# Patient Record
Sex: Female | Born: 1955
Health system: Southern US, Community
[De-identification: ages and names within clinical notes are randomized; demographics above are authoritative.]

## PROBLEM LIST (undated history)

## (undated) DIAGNOSIS — K219 Gastro-esophageal reflux disease without esophagitis: Secondary | ICD-10-CM

## (undated) DIAGNOSIS — F419 Anxiety disorder, unspecified: Secondary | ICD-10-CM

## (undated) DIAGNOSIS — E785 Hyperlipidemia, unspecified: Secondary | ICD-10-CM

## (undated) HISTORY — PX: BUNIONECTOMY: SHX129

## (undated) HISTORY — PX: APPENDECTOMY: SHX54

## (undated) HISTORY — DX: Hyperlipidemia, unspecified: E78.5

## (undated) HISTORY — PX: ABDOMINAL HYSTERECTOMY: SHX81

## (undated) HISTORY — PX: TONSILLECTOMY: SUR1361

## (undated) HISTORY — PX: CHOLECYSTECTOMY: SHX55

## (undated) HISTORY — PX: BREAST EXCISIONAL BIOPSY: SUR124

---

## 2014-02-02 LAB — HM PAP SMEAR

## 2014-10-05 ENCOUNTER — Other Ambulatory Visit: Payer: Self-pay | Admitting: Internal Medicine

## 2014-10-05 DIAGNOSIS — Z719 Counseling, unspecified: Secondary | ICD-10-CM

## 2014-10-05 DIAGNOSIS — N6452 Nipple discharge: Secondary | ICD-10-CM

## 2014-10-12 ENCOUNTER — Ambulatory Visit
Admission: RE | Admit: 2014-10-12 | Discharge: 2014-10-12 | Disposition: A | Discharge status: Home / Self Care | Payer: Managed Care, Other (non HMO) | Source: Ambulatory Visit | Attending: Internal Medicine | Admitting: Internal Medicine

## 2014-10-12 DIAGNOSIS — Z719 Counseling, unspecified: Secondary | ICD-10-CM

## 2014-10-15 ENCOUNTER — Ambulatory Visit: Payer: Self-pay | Admitting: Surgery

## 2014-10-15 NOTE — H&P (Signed)
Lynn Bautista 10/15/2014 3:03 PM Location: [facility] Patient #: [id number] DOB: 1955/12/25 Divorced / Language: Lynn Bautista / Race: White Female History of Present Illness Lynn Moores A. Ellsie Violette MD; 10/15/2014 3:45 PM) Patient words: right breast  Pt sent at the request of Dr Enriqueta Shutter for right breast bloody nipple discharge since february of this year. It is scant but persistent. It is dark in nature. Mammgram normal and U/S showed a 1.6 cm hypoechoic lesion. Bx done and showed stromal fibrosis. Family history of breast cancer. No mass or pain. Dense breast tissue on mammogram.  The patient is a 59 year old female   Other Problems Lynn Bautista, Lynn Bautista; 10/15/2014 3:03 PM) Anxiety Disorder Cancer Chest pain Cholelithiasis Gastric Ulcer Gastroesophageal Reflux Disease Migraine Headache  Past Surgical History Lynn Bautista, CMA; 10/15/2014 3:03 PM) Appendectomy Breast Biopsy Bilateral. multiple Breast Mass; Local Excision Right. Colon Polyp Removal - Colonoscopy Foot Surgery Bilateral. Gallbladder Surgery - Laparoscopic Hysterectomy (not due to cancer) - Partial Oral Surgery Tonsillectomy  Diagnostic Studies History Lynn Bautista, CMA; 10/15/2014 3:03 PM) Colonoscopy within last year Mammogram within last year Pap Smear 1-5 years ago  Allergies Lynn Bautista, CMA; 10/15/2014 3:03 PM) No Known Drug Allergies 10/15/2014  Medication History (Lynn Bautista, CMA; 10/15/2014 3:03 PM) Atorvastatin Calcium (10MG  Tablet, Oral) Active. Venlafaxine HCl ER (75MG  Capsule ER 24HR, Oral) Active.  Social History Lynn Bautista, CMA; 10/15/2014 3:03 PM) Alcohol use Moderate alcohol use. Caffeine use Carbonated beverages, Coffee, Tea. No drug use Tobacco use Current some day smoker.  Family History Lynn Bautista, CMA; 10/15/2014 3:03 PM) Bleeding disorder Brother. Breast Cancer Mother. Cancer Mother. Heart Disease Father, Mother. Migraine Headache Brother,  Mother, Sister. Prostate Cancer Father.  Pregnancy / Birth History Lynn Bautista, [place]; 10/15/2014 3:03 PM) Age at menarche 59 years. Age of menopause 83-50 Contraceptive History Oral contraceptives. Gravida 2 Maternal age 4-20 Para 2     Review of Systems (Lynn Bautista; 10/15/2014 3:03 PM) General Not Present- Appetite Loss, Chills, Fatigue, Fever, Night Sweats, Weight Gain and Weight Loss. Skin Not Present- Change in Wart/Mole, Dryness, Hives, Jaundice, New Lesions, Non-Healing Wounds, Rash and Ulcer. HEENT Not Present- Earache, Hearing Loss, Hoarseness, Nose Bleed, Oral Ulcers, Ringing in the Ears, Seasonal Allergies, Sinus Pain, Sore Throat, Visual Disturbances, Wears glasses/contact lenses and Yellow Eyes. Respiratory Not Present- Bloody sputum, Chronic Cough, Difficulty Breathing, Snoring and Wheezing. Breast Present- Nipple Discharge. Not Present- Breast Mass, Breast Pain and Skin Changes. Cardiovascular Not Present- Chest Pain, Difficulty Breathing Lying Down, Leg Cramps, Palpitations, Rapid Heart Rate, Shortness of Breath and Swelling of Extremities. Gastrointestinal Not Present- Abdominal Pain, Bloating, Bloody Stool, Change in Bowel Habits, Chronic diarrhea, Constipation, Difficulty Swallowing, Excessive gas, Gets full quickly at meals, Hemorrhoids, Indigestion, Nausea, Rectal Pain and Vomiting. Female Genitourinary Not Present- Frequency, Nocturia, Painful Urination, Pelvic Pain and Urgency. Musculoskeletal Not Present- Back Pain, Joint Pain, Joint Stiffness, Muscle Pain, Muscle Weakness and Swelling of Extremities. Neurological Not Present- Decreased Memory, Fainting, Headaches, Numbness, Seizures, Tingling, Tremor, Trouble walking and Weakness. Psychiatric Present- Anxiety. Not Present- Bipolar, Change in Sleep Pattern, Depression, Fearful and Frequent crying. Endocrine Not Present- Cold Intolerance, Excessive Hunger, Hair Changes, Heat Intolerance, Hot flashes and New  Diabetes. Hematology Not Present- Easy Bruising, Excessive bleeding, Gland problems, HIV and Persistent Infections.  Vitals (Lynn Bautista CMA; 10/15/2014 3:04 PM) 10/15/2014 3:04 PM Weight: 160 lb Height: 61in Body Surface Area: 1.77 m Body Mass Index: 30.23 kg/m Temp.: 54F(Temporal)  Pulse: 77 (Regular)  BP: 130/74 (Sitting, Left Arm,  Standard)     Physical Exam (Lynn Bautista A. Lynn Alvillar MD; 10/15/2014 3:43 PM)  General Mental Status-Alert. General Appearance-Consistent with stated age. Hydration-Well hydrated. Voice-Normal.  Head and Neck Head-normocephalic, atraumatic with no lesions or palpable masses. Trachea-midline. Thyroid Gland Characteristics - normal size and consistency.  Chest and Lung Exam Chest and lung exam reveals -quiet, even and easy respiratory effort with no use of accessory muscles and on auscultation, normal breath sounds, no adventitious sounds and normal vocal resonance. Inspection Chest Wall - Normal. Back - normal.  Breast Note: dense breast tissue brown right nipple discharge no mass subareolar palpated on the right minimal discharge left breast has no nipple discharge, mass or deformity.   Cardiovascular Cardiovascular examination reveals -normal heart sounds, regular rate and rhythm with no murmurs and normal pedal pulses bilaterally.  Musculoskeletal Normal Exam - Left-Upper Extremity Strength Normal and Lower Extremity Strength Normal. Normal Exam - Right-Upper Extremity Strength Normal and Lower Extremity Strength Normal.  Lymphatic Head & Neck  General Head & Neck Lymphatics: Bilateral - Description - Normal. Axillary  General Axillary Region: Bilateral - Description - Normal. Tenderness - Non Tender.    Assessment & Plan (Lynn Nocito A. Refoel Palladino MD; 10/15/2014 3:45 PM)  NIPPLE DISCHARGE, BLOODY (N64.52) Impression: right breast core biopsy nondiagnostic showing stromal fibrosis and 1 .6 cm hypoechoic  lesion at 9 oclock not palpable on exam. I suspect the biopsy is non diagnostic at this point. I suspect a papiloma as likely cause and carcinoma less likely. I dont' think a MRI useful at this point. Recommend central duct excsicion and treatment of discharge and would give better tissue. Risk of bleeding infection cosmetic deformity nipple loss and the need for mre surgery. Success rates of 95 % at treating and diagnosing this problem. Localizing the clip not useful in this setting since pathology does not match up to her clinical presentation. She wishes to proceed .  Current Plans Pt Education - [facility] Free Text Education/Instructions: discussed with patient and provided information. Pt Education - Nipple discharge: discussed with patient and provided information.

## 2014-12-24 ENCOUNTER — Encounter (HOSPITAL_BASED_OUTPATIENT_CLINIC_OR_DEPARTMENT_OTHER): Payer: Self-pay | Admitting: *Deleted

## 2014-12-26 ENCOUNTER — Encounter (HOSPITAL_BASED_OUTPATIENT_CLINIC_OR_DEPARTMENT_OTHER)
Admission: RE | Admit: 2014-12-26 | Discharge: 2014-12-26 | Disposition: A | Discharge status: Home / Self Care | Payer: Managed Care, Other (non HMO) | Source: Ambulatory Visit | Attending: Surgery | Admitting: Surgery

## 2014-12-26 DIAGNOSIS — N6011 Diffuse cystic mastopathy of right breast: Secondary | ICD-10-CM | POA: Diagnosis not present

## 2014-12-26 DIAGNOSIS — K219 Gastro-esophageal reflux disease without esophagitis: Secondary | ICD-10-CM | POA: Diagnosis not present

## 2014-12-26 DIAGNOSIS — Z90710 Acquired absence of both cervix and uterus: Secondary | ICD-10-CM | POA: Diagnosis not present

## 2014-12-26 DIAGNOSIS — N6452 Nipple discharge: Secondary | ICD-10-CM | POA: Diagnosis present

## 2014-12-26 DIAGNOSIS — F418 Other specified anxiety disorders: Secondary | ICD-10-CM | POA: Diagnosis not present

## 2014-12-26 DIAGNOSIS — Z79899 Other long term (current) drug therapy: Secondary | ICD-10-CM | POA: Diagnosis not present

## 2014-12-26 DIAGNOSIS — F17200 Nicotine dependence, unspecified, uncomplicated: Secondary | ICD-10-CM | POA: Diagnosis not present

## 2014-12-26 DIAGNOSIS — Z803 Family history of malignant neoplasm of breast: Secondary | ICD-10-CM | POA: Diagnosis not present

## 2014-12-26 LAB — COMPREHENSIVE METABOLIC PANEL
ALK PHOS: 81 U/L (ref 38–126)
ALT: 46 U/L (ref 14–54)
AST: 25 U/L (ref 15–41)
Albumin: 3.9 g/dL (ref 3.5–5.0)
Anion gap: 7 (ref 5–15)
BILIRUBIN TOTAL: 0.7 mg/dL (ref 0.3–1.2)
BUN: 15 mg/dL (ref 6–20)
CALCIUM: 9.4 mg/dL (ref 8.9–10.3)
CO2: 28 mmol/L (ref 22–32)
CREATININE: 0.9 mg/dL (ref 0.44–1.00)
Chloride: 104 mmol/L (ref 101–111)
GFR calc non Af Amer: >60 mL/min (ref >60)
Glucose, Bld: 111 mg/dL — ABNORMAL HIGH (ref 65–99)
Potassium: 4 mmol/L (ref 3.5–5.1)
SODIUM: 139 mmol/L (ref 135–145)
TOTAL PROTEIN: 6.8 g/dL (ref 6.5–8.1)

## 2014-12-26 LAB — CBC WITH DIFFERENTIAL/PLATELET
Basophils Absolute: 0 10*3/uL (ref 0.0–0.1)
Basophils Relative: 1 %
EOS ABS: 0.2 10*3/uL (ref 0.0–0.7)
Eosinophils Relative: 3 %
HEMATOCRIT: 41.3 % (ref 36.0–46.0)
HEMOGLOBIN: 13.7 g/dL (ref 12.0–15.0)
LYMPHS ABS: 2.4 10*3/uL (ref 0.7–4.0)
LYMPHS PCT: 38 %
MCH: 32.2 pg (ref 26.0–34.0)
MCHC: 33.2 g/dL (ref 30.0–36.0)
MCV: 96.9 fL (ref 78.0–100.0)
MONOS PCT: 10 %
Monocytes Absolute: 0.6 10*3/uL (ref 0.1–1.0)
NEUTROS ABS: 3.1 10*3/uL (ref 1.7–7.7)
NEUTROS PCT: 48 %
Platelets: 311 10*3/uL (ref 150–400)
RBC: 4.26 MIL/uL (ref 3.87–5.11)
RDW: 12.9 % (ref 11.5–15.5)
WBC: 6.3 10*3/uL (ref 4.0–10.5)

## 2015-01-02 ENCOUNTER — Ambulatory Visit (HOSPITAL_BASED_OUTPATIENT_CLINIC_OR_DEPARTMENT_OTHER)
Admission: RE | Admit: 2015-01-02 | Discharge: 2015-01-02 | Disposition: A | Discharge status: Home / Self Care | Payer: Managed Care, Other (non HMO) | Source: Ambulatory Visit | Attending: Surgery | Admitting: Surgery

## 2015-01-02 ENCOUNTER — Ambulatory Visit (HOSPITAL_BASED_OUTPATIENT_CLINIC_OR_DEPARTMENT_OTHER): Payer: Managed Care, Other (non HMO) | Admitting: Certified Registered"

## 2015-01-02 ENCOUNTER — Encounter (HOSPITAL_BASED_OUTPATIENT_CLINIC_OR_DEPARTMENT_OTHER): Payer: Self-pay | Admitting: Certified Registered"

## 2015-01-02 ENCOUNTER — Encounter (HOSPITAL_BASED_OUTPATIENT_CLINIC_OR_DEPARTMENT_OTHER)
Admission: RE | Disposition: A | Discharge status: Home / Self Care | Payer: Self-pay | Source: Ambulatory Visit | Attending: Surgery

## 2015-01-02 DIAGNOSIS — Z79899 Other long term (current) drug therapy: Secondary | ICD-10-CM | POA: Insufficient documentation

## 2015-01-02 DIAGNOSIS — N6011 Diffuse cystic mastopathy of right breast: Secondary | ICD-10-CM | POA: Diagnosis not present

## 2015-01-02 DIAGNOSIS — F418 Other specified anxiety disorders: Secondary | ICD-10-CM | POA: Insufficient documentation

## 2015-01-02 DIAGNOSIS — K219 Gastro-esophageal reflux disease without esophagitis: Secondary | ICD-10-CM | POA: Insufficient documentation

## 2015-01-02 DIAGNOSIS — N6452 Nipple discharge: Secondary | ICD-10-CM | POA: Insufficient documentation

## 2015-01-02 DIAGNOSIS — Z90710 Acquired absence of both cervix and uterus: Secondary | ICD-10-CM | POA: Insufficient documentation

## 2015-01-02 DIAGNOSIS — Z803 Family history of malignant neoplasm of breast: Secondary | ICD-10-CM | POA: Insufficient documentation

## 2015-01-02 DIAGNOSIS — F17200 Nicotine dependence, unspecified, uncomplicated: Secondary | ICD-10-CM | POA: Insufficient documentation

## 2015-01-02 HISTORY — PX: BREAST DUCTAL SYSTEM EXCISION: SHX5242

## 2015-01-02 HISTORY — DX: Gastro-esophageal reflux disease without esophagitis: K21.9

## 2015-01-02 HISTORY — DX: Anxiety disorder, unspecified: F41.9

## 2015-01-02 SURGERY — EXCISION DUCTAL SYSTEM BREAST
Anesthesia: General | Site: Breast | Laterality: Right

## 2015-01-02 MED ORDER — PROPOFOL 10 MG/ML IV BOLUS
INTRAVENOUS | Status: DC | PRN
  Administered 2015-01-02: 200 mg via INTRAVENOUS

## 2015-01-02 MED ORDER — DEXTROSE 5 % IV SOLN
3.0000 g | INTRAVENOUS | Status: AC
  Administered 2015-01-02: 2 g via INTRAVENOUS

## 2015-01-02 MED ORDER — LACTATED RINGERS IV SOLN
INTRAVENOUS | Status: DC
  Administered 2015-01-02 (×2): via INTRAVENOUS

## 2015-01-02 MED ORDER — HYDROMORPHONE HCL 1 MG/ML IJ SOLN: 0.2500 mg | INTRAMUSCULAR | Status: DC | PRN

## 2015-01-02 MED ORDER — BUPIVACAINE-EPINEPHRINE (PF) 0.25% -1:200000 IJ SOLN
INTRAMUSCULAR | Status: AC
  Filled 2015-01-02: qty 30

## 2015-01-02 MED ORDER — OXYCODONE-ACETAMINOPHEN 5-325 MG PO TABS: 1.0000 | ORAL_TABLET | ORAL | Status: DC | PRN

## 2015-01-02 MED ORDER — PROPOFOL 10 MG/ML IV BOLUS
INTRAVENOUS | Status: AC
  Filled 2015-01-02: qty 20

## 2015-01-02 MED ORDER — LIDOCAINE HCL (CARDIAC) 20 MG/ML IV SOLN
INTRAVENOUS | Status: AC
  Filled 2015-01-02: qty 5

## 2015-01-02 MED ORDER — FENTANYL CITRATE (PF) 100 MCG/2ML IJ SOLN
INTRAMUSCULAR | Status: AC
  Filled 2015-01-02: qty 2

## 2015-01-02 MED ORDER — GLYCOPYRROLATE 0.2 MG/ML IJ SOLN: 0.2000 mg | Freq: Once | INTRAMUSCULAR | Status: DC | PRN

## 2015-01-02 MED ORDER — DEXAMETHASONE SODIUM PHOSPHATE 4 MG/ML IJ SOLN
INTRAMUSCULAR | Status: DC | PRN
  Administered 2015-01-02: 10 mg via INTRAVENOUS

## 2015-01-02 MED ORDER — ONDANSETRON HCL 4 MG/2ML IJ SOLN
INTRAMUSCULAR | Status: DC | PRN
  Administered 2015-01-02: 4 mg via INTRAVENOUS

## 2015-01-02 MED ORDER — ONDANSETRON HCL 4 MG/2ML IJ SOLN
INTRAMUSCULAR | Status: AC
  Filled 2015-01-02: qty 2

## 2015-01-02 MED ORDER — LIDOCAINE HCL (CARDIAC) 20 MG/ML IV SOLN
INTRAVENOUS | Status: DC | PRN
  Administered 2015-01-02: 60 mg via INTRAVENOUS

## 2015-01-02 MED ORDER — CHLORHEXIDINE GLUCONATE 4 % EX LIQD: 1.0000 "application " | Freq: Once | CUTANEOUS | Status: DC

## 2015-01-02 MED ORDER — MEPERIDINE HCL 25 MG/ML IJ SOLN: 6.2500 mg | INTRAMUSCULAR | Status: DC | PRN

## 2015-01-02 MED ORDER — FENTANYL CITRATE (PF) 100 MCG/2ML IJ SOLN
50.0000 ug | INTRAMUSCULAR | Status: DC | PRN
  Administered 2015-01-02: 50 ug via INTRAVENOUS

## 2015-01-02 MED ORDER — MIDAZOLAM HCL 2 MG/2ML IJ SOLN
INTRAMUSCULAR | Status: AC
  Filled 2015-01-02: qty 2

## 2015-01-02 MED ORDER — CEFAZOLIN SODIUM-DEXTROSE 2-3 GM-% IV SOLR
INTRAVENOUS | Status: AC
  Filled 2015-01-02: qty 50

## 2015-01-02 MED ORDER — OXYCODONE HCL 5 MG PO TABS: 5.0000 mg | ORAL_TABLET | Freq: Once | ORAL | Status: DC | PRN

## 2015-01-02 MED ORDER — SCOPOLAMINE 1 MG/3DAYS TD PT72: 1.0000 | MEDICATED_PATCH | Freq: Once | TRANSDERMAL | Status: DC | PRN

## 2015-01-02 MED ORDER — MIDAZOLAM HCL 2 MG/2ML IJ SOLN
1.0000 mg | INTRAMUSCULAR | Status: DC | PRN
  Administered 2015-01-02: 2 mg via INTRAVENOUS

## 2015-01-02 MED ORDER — 0.9 % SODIUM CHLORIDE (POUR BTL) OPTIME
TOPICAL | Status: DC | PRN
  Administered 2015-01-02: 200 mL

## 2015-01-02 MED ORDER — BUPIVACAINE-EPINEPHRINE 0.25% -1:200000 IJ SOLN
INTRAMUSCULAR | Status: DC | PRN
  Administered 2015-01-02: 17 mL

## 2015-01-02 MED ORDER — OXYCODONE HCL 5 MG/5ML PO SOLN: 5.0000 mg | Freq: Once | ORAL | Status: DC | PRN

## 2015-01-02 MED ORDER — DEXAMETHASONE SODIUM PHOSPHATE 10 MG/ML IJ SOLN
INTRAMUSCULAR | Status: AC
  Filled 2015-01-02: qty 1

## 2015-01-02 SURGICAL SUPPLY — 43 items
APPLIER CLIP 9.375 MED OPEN (MISCELLANEOUS)
BLADE SURG 15 STRL LF DISP TIS (BLADE) ×1 IMPLANT
BLADE SURG 15 STRL SS (BLADE) ×2
CANISTER SUCT 1200ML W/VALVE (MISCELLANEOUS) ×3 IMPLANT
CHLORAPREP W/TINT 26ML (MISCELLANEOUS) ×3 IMPLANT
CLIP APPLIE 9.375 MED OPEN (MISCELLANEOUS) IMPLANT
CLIP TI WIDE RED SMALL 6 (CLIP) IMPLANT
COVER BACK TABLE 60X90IN (DRAPES) ×3 IMPLANT
COVER MAYO STAND STRL (DRAPES) ×3 IMPLANT
DECANTER SPIKE VIAL GLASS SM (MISCELLANEOUS) ×3 IMPLANT
DEVICE DUBIN W/COMP PLATE 8390 (MISCELLANEOUS) IMPLANT
DRAPE LAPAROSCOPIC ABDOMINAL (DRAPES) IMPLANT
DRAPE LAPAROTOMY 100X72 PEDS (DRAPES) ×3 IMPLANT
DRAPE UTILITY XL STRL (DRAPES) ×3 IMPLANT
ELECT COATED BLADE 2.86 ST (ELECTRODE) ×3 IMPLANT
ELECT REM PT RETURN 9FT ADLT (ELECTROSURGICAL) ×3
ELECTRODE REM PT RTRN 9FT ADLT (ELECTROSURGICAL) ×1 IMPLANT
GLOVE BIOGEL PI IND STRL 7.0 (GLOVE) ×4 IMPLANT
GLOVE BIOGEL PI IND STRL 8 (GLOVE) ×1 IMPLANT
GLOVE BIOGEL PI INDICATOR 7.0 (GLOVE) ×8
GLOVE BIOGEL PI INDICATOR 8 (GLOVE) ×2
GLOVE ECLIPSE 6.5 STRL STRAW (GLOVE) ×6 IMPLANT
GLOVE ECLIPSE 7.5 STRL STRAW (GLOVE) ×3 IMPLANT
GLOVE ECLIPSE 8.0 STRL XLNG CF (GLOVE) ×3 IMPLANT
GOWN STRL REUS W/ TWL LRG LVL3 (GOWN DISPOSABLE) ×2 IMPLANT
GOWN STRL REUS W/TWL LRG LVL3 (GOWN DISPOSABLE) ×4
LIQUID BAND (GAUZE/BANDAGES/DRESSINGS) ×3 IMPLANT
NEEDLE HYPO 25X1 1.5 SAFETY (NEEDLE) ×3 IMPLANT
NS IRRIG 1000ML POUR BTL (IV SOLUTION) ×3 IMPLANT
PACK BASIN DAY SURGERY FS (CUSTOM PROCEDURE TRAY) ×3 IMPLANT
PENCIL BUTTON HOLSTER BLD 10FT (ELECTRODE) ×3 IMPLANT
SLEEVE SCD COMPRESS KNEE MED (MISCELLANEOUS) ×3 IMPLANT
SPONGE LAP 4X18 X RAY DECT (DISPOSABLE) ×3 IMPLANT
STAPLER VISISTAT 35W (STAPLE) IMPLANT
SUT MON AB 4-0 PC3 18 (SUTURE) ×3 IMPLANT
SUT SILK 2 0 SH (SUTURE) IMPLANT
SUT VICRYL 3-0 CR8 SH (SUTURE) ×3 IMPLANT
SYR CONTROL 10ML LL (SYRINGE) ×3 IMPLANT
TOWEL OR 17X24 6PK STRL BLUE (TOWEL DISPOSABLE) ×6 IMPLANT
TOWEL OR NON WOVEN STRL DISP B (DISPOSABLE) ×3 IMPLANT
TUBE CONNECTING 20'X1/4 (TUBING) ×1
TUBE CONNECTING 20X1/4 (TUBING) ×2 IMPLANT
YANKAUER SUCT BULB TIP NO VENT (SUCTIONS) ×3 IMPLANT

## 2015-01-02 NOTE — Anesthesia Procedure Notes (Signed)
Procedure Name: LMA Insertion Date/Time: 01/02/2015 8:22 AM Performed by: Sabah Zucco D Pre-anesthesia Checklist: Patient identified, Emergency Drugs available, Suction available and Patient being monitored Patient Re-evaluated:Patient Re-evaluated prior to inductionOxygen Delivery Method: Circle System Utilized Preoxygenation: Pre-oxygenation with 100% oxygen Intubation Type: IV induction Ventilation: Mask ventilation without difficulty LMA: LMA inserted LMA Size: 4.0 Number of attempts: 1 Airway Equipment and Method: Bite block Placement Confirmation: positive ETCO2 Tube secured with: Tape Dental Injury: Teeth and Oropharynx as per pre-operative assessment

## 2015-01-02 NOTE — Interval H&P Note (Signed)
History and Physical Interval Note:  01/02/2015 7:55 AM  Lynn Bautista  has presented today for surgery, with the diagnosis of Right Nipple Discharge  The various methods of treatment have been discussed with the patient and family. After consideration of risks, benefits and other options for treatment, the patient has consented to  Procedure(s): RIGHT CENTRAL DUCT EXCISION (Right) as a surgical intervention .  The patient's history has been reviewed, patient examined, no change in status, stable for surgery.  I have reviewed the patient's chart and labs.  Questions were answered to the patient's satisfaction.     [name] A.

## 2015-01-02 NOTE — Anesthesia Preprocedure Evaluation (Addendum)
Anesthesia Evaluation  Patient identified by MRN, date of birth, ID band Patient awake    Reviewed: Allergy & Precautions, NPO status , Patient's Chart, lab work & pertinent test results  Airway Mallampati: I  TM Distance: >3 FB Neck ROM: Full    Dental  (+) Teeth Intact, Dental Advisory Given   Pulmonary Current Smoker,    breath sounds clear to auscultation       Cardiovascular  Rhythm:Regular Rate:Normal     Neuro/Psych PSYCHIATRIC DISORDERS Anxiety Depression    GI/Hepatic GERD  Medicated,  Endo/Other    Renal/GU      Musculoskeletal   Abdominal   Peds  Hematology   Anesthesia Other Findings   Reproductive/Obstetrics                            Anesthesia Physical Anesthesia Plan  ASA: II  Anesthesia Plan: General   Post-op Pain Management:    Induction: Intravenous  Airway Management Planned: LMA  Additional Equipment:   Intra-op Plan:   Post-operative Plan: Extubation in OR  Informed Consent: I have reviewed the patients History and Physical, chart, labs and discussed the procedure including the risks, benefits and alternatives for the proposed anesthesia with the patient or authorized representative who has indicated his/her understanding and acceptance.   Dental advisory given  Plan Discussed with: CRNA, Anesthesiologist and Surgeon  Anesthesia Plan Comments:         Anesthesia Quick Evaluation

## 2015-01-02 NOTE — Op Note (Signed)
Preoperative diagnosis: Right nipple bloody discharge  Postoperative diagnosis: Same  Procedure: Right breast central duct excision  Surgeon: Erroll Luna M.D.  Anesthesia: LMA with 0.25% Sensorcaine with epinephrine  EBL: Minimal  Specimen: Right breast central duct tissue pathology  Indications for procedure the patient presents due to a right breast nipple discharge. This is been present for number of months has been bloody in nature. Mammography and ultrasound revealed nonspecific findings in a biopsy of an area at 9:00 1.6 cm from the nipple was nondiagnostic. This was felt to be concerning and she was sent to me for evaluation. We discussed options of MRI versus excision to help treat nipple discharge. She decided to have the area removed since discharge at times could be problematic and an MRI would not assist in that.The procedure has been discussed with the patient. Alternatives to surgery have been discussed with the patient.  Risks of surgery include bleeding,  Infection,  Seroma formation, death,  and the need for further surgery.   The patient understands and wishes to proceed.  Description of procedure: The patient was met in the area and questions are answered. The right breast was marked with the assistance of the patient as the correct side. She was taken back to the operating room placed upon the operating room table. After induction of LMA anesthesia, the right breast was prepped and draped in a sterile fashion. Timeout was done and she received appropriate antibiotics. A probe was used and passed to the nipple in the corresponding duct was easily identified. Curvilinear incision was made along the inferior aspect of the nipple complex nipple was elevated. Dissection was done with the scalpel into we came across the daughter that the probe was in. That duct and subareolar tissue was excised and sent to pathology. Oriented prior to this. The wound was made hemostatic with cautery  and then closed with 3-0 Vicryl and 4-0 Monocryl. Liquid adhesive applied. All final counts found to be correct. Patient was awoke taken to recovery in satisfactory condition.

## 2015-01-02 NOTE — Transfer of Care (Signed)
Immediate Anesthesia Transfer of Care Note  Patient: Lynn Bautista  Procedure(s) Performed: Procedure(s): RIGHT BREAST CENTRAL DUCT EXCISION (Right)  Patient Location: PACU  Anesthesia Type:General  Level of Consciousness: awake and patient cooperative  Airway & Oxygen Therapy: Patient Spontanous Breathing and Patient connected to face mask oxygen  Post-op Assessment: Report given to RN and Post -op Vital signs reviewed and stable  Post vital signs: Reviewed and stable  Last Vitals:  Filed Vitals:   01/02/15 0740 01/02/15 0907  BP: 120/64   Pulse: 64 84  Temp: 36.8 C   Resp: 16     Complications: No apparent anesthesia complications

## 2015-01-02 NOTE — H&P (Signed)
H&P   Lynn Bautista (MR# [id number])      H&P Info    Author Note Status Last Update User Last Update Date/Time   Erroll Luna, MD Signed Erroll Luna, MD 10/15/2014 3:45 PM    H&P    Expand All Collapse All   Lynn Bautista 10/15/2014 3:03 PM Location: [facility] Patient #: [id number] DOB: 02-May-1955 Divorced / Language: [name] / Race: White Female History of Present Illness Lynn Moores A. Rylen Swindler MD; 10/15/2014 3:45 PM) Patient words: right breast  Pt sent at the request of Dr Enriqueta Shutter for right breast bloody nipple discharge since february of this year. It is scant but persistent. It is dark in nature. Mammgram normal and U/S showed a 1.6 cm hypoechoic lesion. Bx done and showed stromal fibrosis. Family history of breast cancer. No mass or pain. Dense breast tissue on mammogram.  The patient is a 59 year old female   Other Problems Marjean Donna, [place]; 10/15/2014 3:03 PM) Anxiety Disorder Cancer Chest pain Cholelithiasis Gastric Ulcer Gastroesophageal Reflux Disease Migraine Headache  Past Surgical History Marjean Donna, CMA; 10/15/2014 3:03 PM) Appendectomy Breast Biopsy Bilateral. multiple Breast Mass; Local Excision Right. Colon Polyp Removal - Colonoscopy Foot Surgery Bilateral. Gallbladder Surgery - Laparoscopic Hysterectomy (not due to cancer) - Partial Oral Surgery Tonsillectomy  Diagnostic Studies History Marjean Donna, CMA; 10/15/2014 3:03 PM) Colonoscopy within last year Mammogram within last year Pap Smear 1-5 years ago  Allergies Marjean Donna, CMA; 10/15/2014 3:03 PM) No Known Drug Allergies 10/15/2014  Medication History (Sonya Bynum, CMA; 10/15/2014 3:03 PM) Atorvastatin Calcium (10MG  Tablet, Oral) Active. Venlafaxine HCl ER (75MG  Capsule ER 24HR, Oral) Active.  Social History Marjean Donna, CMA; 10/15/2014 3:03 PM) Alcohol use Moderate alcohol use. Caffeine use Carbonated beverages, Coffee, Tea. No drug  use Tobacco use Current some day smoker.  Family History Marjean Donna, CMA; 10/15/2014 3:03 PM) Bleeding disorder Brother. Breast Cancer Mother. Cancer Mother. Heart Disease Father, Mother. Migraine Headache Brother, Mother, Sister. Prostate Cancer Father.  Pregnancy / Birth History Marjean Donna, [place]; 10/15/2014 3:03 PM) Age at menarche 34 years. Age of menopause 83-50 Contraceptive History Oral contraceptives. Gravida 2 Maternal age 33-20 Para 2     Review of Systems ([place]; 10/15/2014 3:03 PM) General Not Present- Appetite Loss, Chills, Fatigue, Fever, Night Sweats, Weight Gain and Weight Loss. Skin Not Present- Change in Wart/Mole, Dryness, Hives, Jaundice, New Lesions, Non-Healing Wounds, Rash and Ulcer. HEENT Not Present- Earache, Hearing Loss, Hoarseness, Nose Bleed, Oral Ulcers, Ringing in the Ears, Seasonal Allergies, Sinus Pain, Sore Throat, Visual Disturbances, Wears glasses/contact lenses and Yellow Eyes. Respiratory Not Present- Bloody sputum, Chronic Cough, Difficulty Breathing, Snoring and Wheezing. Breast Present- Nipple Discharge. Not Present- Breast Mass, Breast Pain and Skin Changes. Cardiovascular Not Present- Chest Pain, Difficulty Breathing Lying Down, Leg Cramps, Palpitations, Rapid Heart Rate, Shortness of Breath and Swelling of Extremities. Gastrointestinal Not Present- Abdominal Pain, Bloating, Bloody Stool, Change in Bowel Habits, Chronic diarrhea, Constipation, Difficulty Swallowing, Excessive gas, Gets full quickly at meals, Hemorrhoids, Indigestion, Nausea, Rectal Pain and Vomiting. Female Genitourinary Not Present- Frequency, Nocturia, Painful Urination, Pelvic Pain and Urgency. Musculoskeletal Not Present- Back Pain, Joint Pain, Joint Stiffness, Muscle Pain, Muscle Weakness and Swelling of Extremities. Neurological Not Present- Decreased Memory, Fainting, Headaches, Numbness, Seizures, Tingling, Tremor, Trouble walking and  Weakness. Psychiatric Present- Anxiety. Not Present- Bipolar, Change in Sleep Pattern, Depression, Fearful and Frequent crying. Endocrine Not Present- Cold Intolerance, Excessive Hunger, Hair Changes, Heat Intolerance, Hot flashes and New Diabetes.  Hematology Not Present- Easy Bruising, Excessive bleeding, Gland problems, HIV and Persistent Infections.  Vitals (Sonya Bynum CMA; 10/15/2014 3:04 PM) 10/15/2014 3:04 PM Weight: 160 lb Height: 61in Body Surface Area: 1.77 m Body Mass Index: 30.23 kg/m Temp.: 75F(Temporal)  Pulse: 77 (Regular)  BP: 130/74 (Sitting, Left Arm, Standard)     Physical Exam (Deserie Dirks A. Ameliyah Sarno MD; 10/15/2014 3:43 PM)  General Mental Status-Alert. General Appearance-Consistent with stated age. Hydration-Well hydrated. Voice-Normal.  Head and Neck Head-normocephalic, atraumatic with no lesions or palpable masses. Trachea-midline. Thyroid Gland Characteristics - normal size and consistency.  Chest and Lung Exam Chest and lung exam reveals -quiet, even and easy respiratory effort with no use of accessory muscles and on auscultation, normal breath sounds, no adventitious sounds and normal vocal resonance. Inspection Chest Wall - Normal. Back - normal.  Breast Note: dense breast tissue brown right nipple discharge no mass subareolar palpated on the right minimal discharge left breast has no nipple discharge, mass or deformity.   Cardiovascular Cardiovascular examination reveals -normal heart sounds, regular rate and rhythm with no murmurs and normal pedal pulses bilaterally.  Musculoskeletal Normal Exam - Left-Upper Extremity Strength Normal and Lower Extremity Strength Normal. Normal Exam - Right-Upper Extremity Strength Normal and Lower Extremity Strength Normal.  Lymphatic Head & Neck  General Head & Neck Lymphatics: Bilateral - Description - Normal. Axillary  General Axillary Region: Bilateral - Description -  Normal. Tenderness - Non Tender.    Assessment & Plan (Jovonda Selner A. Shaton Lore MD; 10/15/2014 3:45 PM)  NIPPLE DISCHARGE, BLOODY (N64.52) Impression: right breast core biopsy nondiagnostic showing stromal fibrosis and 1 .6 cm hypoechoic lesion at 9 oclock not palpable on exam. I suspect the biopsy is non diagnostic at this point. I suspect a papiloma as likely cause and carcinoma less likely. I dont' think a MRI useful at this point. Recommend central duct excsicion and treatment of discharge and would give better tissue. Risk of bleeding infection cosmetic deformity nipple loss and the need for mre surgery. Success rates of 95 % at treating and diagnosing this problem. Localizing the clip not useful in this setting since pathology does not match up to her clinical presentation. She wishes to proceed .  Current Plans Pt Education - [facility] Free Text Education/Instructions: discussed with patient and provided information. Pt Education - Nipple discharge: discussed with patient and provided information.

## 2015-01-02 NOTE — Anesthesia Postprocedure Evaluation (Signed)
Anesthesia Post Note  Patient: Lynn Bautista  Procedure(s) Performed: Procedure(s) (LRB): RIGHT BREAST CENTRAL DUCT EXCISION (Right)  Patient location during evaluation: PACU Anesthesia Type: General Level of consciousness: awake and alert Pain management: pain level controlled Vital Signs Assessment: post-procedure vital signs reviewed and stable Respiratory status: spontaneous breathing, nonlabored ventilation and respiratory function stable Cardiovascular status: blood pressure returned to baseline and stable Postop Assessment: no signs of nausea or vomiting Anesthetic complications: no    Last Vitals:  Filed Vitals:   01/02/15 0945 01/02/15 0958  BP: 111/72 125/72  Pulse: 75 74  Temp:  36.6 C  Resp: 17 18    Last Pain: There were no vitals filed for this visit.               Denina Rieger A

## 2015-01-02 NOTE — Discharge Instructions (Signed)
[facility] °Office Phone Number [phone] ° °BREAST BIOPSY/ PARTIAL MASTECTOMY: POST OP INSTRUCTIONS ° °Always review your discharge instruction sheet given to you by the facility where your surgery was performed. ° °IF YOU HAVE DISABILITY OR FAMILY LEAVE FORMS, YOU MUST BRING THEM TO THE OFFICE FOR PROCESSING.  DO NOT GIVE THEM TO YOUR DOCTOR. ° °1. A prescription for pain medication may be given to you upon discharge.  Take your pain medication as prescribed, if needed.  If narcotic pain medicine is not needed, then you may take acetaminophen (Tylenol) or ibuprofen (Advil) as needed. °2. Take your usually prescribed medications unless otherwise directed °3. If you need a refill on your pain medication, please contact your pharmacy.  They will contact our office to request authorization.  Prescriptions will not be filled after 5pm or on week-ends. °4. You should eat very light the first 24 hours after surgery, such as soup, crackers, pudding, etc.  Resume your normal diet the day after surgery. °5. Most patients will experience some swelling and bruising in the breast.  Ice packs and a good support bra will help.  Swelling and bruising can take several days to resolve.  °6. It is common to experience some constipation if taking pain medication after surgery.  Increasing fluid intake and taking a stool softener will usually help or prevent this problem from occurring.  A mild laxative (Milk of Magnesia or Miralax) should be taken according to package directions if there are no bowel movements after 48 hours. °7. Unless discharge instructions indicate otherwise, you may remove your bandages 24-48 hours after surgery, and you may shower at that time.  You may have steri-strips (small skin tapes) in place directly over the incision.  These strips should be left on the skin for 7-10 days.  If your surgeon used skin glue on the incision, you may shower in 24 hours.  The glue will flake off over the  next 2-3 weeks.  Any sutures or staples will be removed at the office during your follow-up visit. °8. ACTIVITIES:  You may resume regular daily activities (gradually increasing) beginning the next day.  Wearing a good support bra or sports bra minimizes pain and swelling.  You may have sexual intercourse when it is comfortable. °a. You may drive when you no longer are taking prescription pain medication, you can comfortably wear a seatbelt, and you can safely maneuver your car and apply brakes. °b. RETURN TO WORK:  ______________________________________________________________________________________ °9. You should see your doctor in the office for a follow-up appointment approximately two weeks after your surgery.  Your doctor’s nurse will typically make your follow-up appointment when she calls you with your pathology report.  Expect your pathology report 2-3 business days after your surgery.  You may call to check if you do not hear from us after three days. °10. OTHER INSTRUCTIONS: _______________________________________________________________________________________________ _____________________________________________________________________________________________________________________________________ °_____________________________________________________________________________________________________________________________________ °_____________________________________________________________________________________________________________________________________ ° °WHEN TO CALL YOUR DOCTOR: °1. Fever over 101.0 °2. Nausea and/or vomiting. °3. Extreme swelling or bruising. °4. Continued bleeding from incision. °5. Increased pain, redness, or drainage from the incision. ° °The clinic staff is available to answer your questions during regular business hours.  Please don’t hesitate to call and ask to speak to one of the nurses for clinical concerns.  If you have a medical emergency, go to the nearest  emergency room or call 911.  A surgeon from [facility] is always on call at the hospital. ° °For further questions, please visit [email or web address]  ° ° ° °  Post Anesthesia Home Care Instructions ° °Activity: °Get plenty of rest for the remainder of the day. A responsible adult should stay with you for 24 hours following the procedure.  °For the next 24 hours, DO NOT: °-Drive a car °-Operate machinery °-Drink alcoholic beverages °-Take any medication unless instructed by your physician °-Make any legal decisions or sign important papers. ° °Meals: °Start with liquid foods such as gelatin or soup. Progress to regular foods as tolerated. Avoid greasy, spicy, heavy foods. If nausea and/or vomiting occur, drink only clear liquids until the nausea and/or vomiting subsides. Call your physician if vomiting continues. ° °Special Instructions/Symptoms: °Your throat may feel dry or sore from the anesthesia or the breathing tube placed in your throat during surgery. If this causes discomfort, gargle with warm salt water. The discomfort should disappear within 24 hours. ° °If you had a scopolamine patch placed behind your ear for the management of post- operative nausea and/or vomiting: ° °1. The medication in the patch is effective for 72 hours, after which it should be removed.  Wrap patch in a tissue and discard in the trash. Wash hands thoroughly with soap and water. °2. You may remove the patch earlier than 72 hours if you experience unpleasant side effects which may include dry mouth, dizziness or visual disturbances. °3. Avoid touching the patch. Wash your hands with soap and water after contact with the patch. ° ° °Call your surgeon if you experience:  ° °1.  Fever over 101.0. °2.  Inability to urinate. °3.  Nausea and/or vomiting. °4.  Extreme swelling or bruising at the surgical site. °5.  Continued bleeding from the incision. °6.  Increased pain, redness or drainage from the incision. °7.   Problems related to your pain medication. °8. Any change in color, movement and/or sensation °9. Any problems and/or concerns °  ° °

## 2015-01-03 ENCOUNTER — Encounter (HOSPITAL_BASED_OUTPATIENT_CLINIC_OR_DEPARTMENT_OTHER): Payer: Self-pay | Admitting: Surgery

## 2015-05-13 ENCOUNTER — Ambulatory Visit (INDEPENDENT_AMBULATORY_CARE_PROVIDER_SITE_OTHER): Payer: Managed Care, Other (non HMO) | Admitting: Family Medicine

## 2015-05-13 ENCOUNTER — Encounter: Payer: Self-pay | Admitting: Family Medicine

## 2015-05-13 VITALS — BP 130/80 | HR 76 | Temp 97.9°F | Ht 61.42 in | Wt 152.3 lb

## 2015-05-13 DIAGNOSIS — K219 Gastro-esophageal reflux disease without esophagitis: Secondary | ICD-10-CM

## 2015-05-13 DIAGNOSIS — R252 Cramp and spasm: Secondary | ICD-10-CM | POA: Diagnosis not present

## 2015-05-13 DIAGNOSIS — F419 Anxiety disorder, unspecified: Secondary | ICD-10-CM

## 2015-05-13 DIAGNOSIS — E785 Hyperlipidemia, unspecified: Secondary | ICD-10-CM

## 2015-05-13 LAB — CBC WITH DIFFERENTIAL/PLATELET
BASOS ABS: 0 10*3/uL (ref 0.0–0.1)
Basophils Relative: 0.6 % (ref 0.0–3.0)
EOS ABS: 0.2 10*3/uL (ref 0.0–0.7)
Eosinophils Relative: 3.2 % (ref 0.0–5.0)
HEMATOCRIT: 40.5 % (ref 36.0–46.0)
Hemoglobin: 13.6 g/dL (ref 12.0–15.0)
LYMPHS ABS: 2.6 10*3/uL (ref 0.7–4.0)
LYMPHS PCT: 39.7 % (ref 12.0–46.0)
MCHC: 33.7 g/dL (ref 30.0–36.0)
MCV: 93.3 fl (ref 78.0–100.0)
MONO ABS: 0.5 10*3/uL (ref 0.1–1.0)
Monocytes Relative: 8.3 % (ref 3.0–12.0)
NEUTROS ABS: 3.2 10*3/uL (ref 1.4–7.7)
NEUTROS PCT: 48.2 % (ref 43.0–77.0)
PLATELETS: 294 10*3/uL (ref 150.0–400.0)
RBC: 4.34 Mil/uL (ref 3.87–5.11)
RDW: 13.6 % (ref 11.5–15.5)
WBC: 6.5 10*3/uL (ref 4.0–10.5)

## 2015-05-13 LAB — BASIC METABOLIC PANEL
BUN: 12 mg/dL (ref 6–23)
CHLORIDE: 105 meq/L (ref 96–112)
CO2: 29 meq/L (ref 19–32)
Calcium: 9.5 mg/dL (ref 8.4–10.5)
Creatinine, Ser: 0.67 mg/dL (ref 0.40–1.20)
GFR: 94.78 mL/min (ref >60.00)
GLUCOSE: 95 mg/dL (ref 70–99)
POTASSIUM: 4.2 meq/L (ref 3.5–5.1)
SODIUM: 141 meq/L (ref 135–145)

## 2015-05-13 LAB — LIPID PANEL
CHOLESTEROL: 192 mg/dL (ref 0–200)
HDL: 45.9 mg/dL (ref >39.00)
LDL Cholesterol: 122 mg/dL — ABNORMAL HIGH (ref 0–99)
NONHDL: 145.75
Total CHOL/HDL Ratio: 4
Triglycerides: 120 mg/dL (ref 0.0–149.0)
VLDL: 24 mg/dL (ref 0.0–40.0)

## 2015-05-13 LAB — HEPATIC FUNCTION PANEL
ALBUMIN: 4.1 g/dL (ref 3.5–5.2)
ALK PHOS: 85 U/L (ref 39–117)
ALT: 28 U/L (ref 0–35)
AST: 17 U/L (ref 0–37)
BILIRUBIN DIRECT: 0.1 mg/dL (ref 0.0–0.3)
TOTAL PROTEIN: 6.5 g/dL (ref 6.0–8.3)
Total Bilirubin: 0.5 mg/dL (ref 0.2–1.2)

## 2015-05-13 LAB — HEMOGLOBIN A1C: HEMOGLOBIN A1C: 5.8 % (ref 4.6–6.5)

## 2015-05-13 LAB — TSH: TSH: 1.43 u[IU]/mL (ref 0.35–4.50)

## 2015-05-13 MED ORDER — OMEPRAZOLE 20 MG PO CPDR: 20.0000 mg | DELAYED_RELEASE_CAPSULE | Freq: Every day | ORAL | Status: DC

## 2015-05-13 MED ORDER — VENLAFAXINE HCL 75 MG PO TABS: 75.0000 mg | ORAL_TABLET | Freq: Two times a day (BID) | ORAL | Status: DC

## 2015-05-13 MED ORDER — ATORVASTATIN CALCIUM 10 MG PO TABS: 10.0000 mg | ORAL_TABLET | Freq: Every day | ORAL | Status: DC

## 2015-05-13 NOTE — Patient Instructions (Signed)
It was a pleasure meeting you today! Please go to lab before you leave for blood work. Results will be called to you within one week or sooner if needed. At your convenience, please schedule a physical.  Gastroesophageal Reflux Disease, Adult Normally, food travels down the esophagus and stays in the stomach to be digested. However, when a person has gastroesophageal reflux disease (GERD), food and stomach acid move back up into the esophagus. When this happens, the esophagus becomes sore and inflamed. Over time, GERD can create small holes (ulcers) in the lining of the esophagus.  CAUSES This condition is caused by a problem with the muscle between the esophagus and the stomach (lower esophageal sphincter, or LES). Normally, the LES muscle closes after food passes through the esophagus to the stomach. When the LES is weakened or abnormal, it does not close properly, and that allows food and stomach acid to go back up into the esophagus. The LES can be weakened by certain dietary substances, medicines, and medical conditions, including:  Tobacco use.  Pregnancy.  Having a hiatal hernia.  Heavy alcohol use.  Certain foods and beverages, such as coffee, chocolate, onions, and peppermint. RISK FACTORS This condition is more likely to develop in:  People who have an increased body weight.  People who have connective tissue disorders.  People who use NSAID medicines. SYMPTOMS Symptoms of this condition include:  Heartburn.  Difficult or painful swallowing.  The feeling of having a lump in the throat.  Abitter taste in the mouth.  Bad breath.  Having a large amount of saliva.  Having an upset or bloated stomach.  Belching.  Chest pain.  Shortness of breath or wheezing.  Ongoing (chronic) cough or a night-time cough.  Wearing away of tooth enamel.  Weight loss. Different conditions can cause chest pain. Make sure to see your health care provider if you experience chest  pain. DIAGNOSIS Your health care provider will take a medical history and perform a physical exam. To determine if you have mild or severe GERD, your health care provider may also monitor how you respond to treatment. You may also have other tests, including:  An endoscopy toexamine your stomach and esophagus with a small camera.  A test thatmeasures the acidity level in your esophagus.  A test thatmeasures how much pressure is on your esophagus.  A barium swallow or modified barium swallow to show the shape, size, and functioning of your esophagus. TREATMENT The goal of treatment is to help relieve your symptoms and to prevent complications. Treatment for this condition may vary depending on how severe your symptoms are. Your health care provider may recommend:  Changes to your diet.  Medicine.  Surgery. HOME CARE INSTRUCTIONS Diet  Follow a diet as recommended by your health care provider. This may involve avoiding foods and drinks such as:  Coffee and tea (with or without caffeine).  Drinks that containalcohol.  Energy drinks and sports drinks.  Carbonated drinks or sodas.  Chocolate and cocoa.  Peppermint and mint flavorings.  Garlic and onions.  Horseradish.  Spicy and acidic foods, including peppers, chili powder, curry powder, vinegar, hot sauces, and barbecue sauce.  Citrus fruit juices and citrus fruits, such as oranges, lemons, and limes.  Tomato-based foods, such as red sauce, chili, salsa, and pizza with red sauce.  Fried and fatty foods, such as donuts, french fries, potato chips, and high-fat dressings.  High-fat meats, such as hot dogs and fatty cuts of red and white meats, such  as rib eye steak, sausage, ham, and bacon.  High-fat dairy items, such as whole milk, butter, and cream cheese.  Eat small, frequent meals instead of large meals.  Avoid drinking large amounts of liquid with your meals.  Avoid eating meals during the 2-3 hours before  bedtime.  Avoid lying down right after you eat.  Do not exercise right after you eat. General Instructions  Pay attention to any changes in your symptoms.  Take over-the-counter and prescription medicines only as told by your health care provider. Do not take aspirin, ibuprofen, or other NSAIDs unless your health care provider told you to do so.  Do not use any tobacco products, including cigarettes, chewing tobacco, and e-cigarettes. If you need help quitting, ask your health care provider.  Wear loose-fitting clothing. Do not wear anything tight around your waist that causes pressure on your abdomen.  Raise (elevate) the head of your bed 6 inches (15cm).  Try to reduce your stress, such as with yoga or meditation. If you need help reducing stress, ask your health care provider.  If you are overweight, reduce your weight to an amount that is healthy for you. Ask your health care provider for guidance about a safe weight loss goal.  Keep all follow-up visits as told by your health care provider. This is important. SEEK MEDICAL CARE IF:  You have new symptoms.  You have unexplained weight loss.  You have difficulty swallowing, or it hurts to swallow.  You have wheezing or a persistent cough.  Your symptoms do not improve with treatment.  You have a hoarse voice. SEEK IMMEDIATE MEDICAL CARE IF:  You have pain in your arms, neck, jaw, teeth, or back.  You feel sweaty, dizzy, or light-headed.  You have chest pain or shortness of breath.  You vomit and your vomit looks like blood or coffee grounds.  You faint.  Your stool is bloody or black.  You cannot swallow, drink, or eat.   This information is not intended to replace advice given to you by your health care provider. Make sure you discuss any questions you have with your health care provider.   Document Released: 10/29/2004 Document Revised: 10/10/2014 Document Reviewed: 05/16/2014 Elsevier Interactive Patient  Education [employer].
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
## 2015-05-13 NOTE — Progress Notes (Signed)
Pre visit review using our clinic review tool, if applicable. No additional management support is needed unless otherwise documented below in the visit note. 

## 2015-07-04 ENCOUNTER — Encounter: Payer: Self-pay | Admitting: Family Medicine

## 2015-07-04 ENCOUNTER — Ambulatory Visit (INDEPENDENT_AMBULATORY_CARE_PROVIDER_SITE_OTHER): Payer: Managed Care, Other (non HMO) | Admitting: Family Medicine

## 2015-07-04 VITALS — BP 128/88 | HR 78 | Temp 97.8°F | Ht 61.25 in | Wt 153.0 lb

## 2015-07-04 DIAGNOSIS — E785 Hyperlipidemia, unspecified: Secondary | ICD-10-CM | POA: Diagnosis not present

## 2015-07-04 DIAGNOSIS — Z1239 Encounter for other screening for malignant neoplasm of breast: Secondary | ICD-10-CM

## 2015-07-04 DIAGNOSIS — F419 Anxiety disorder, unspecified: Secondary | ICD-10-CM | POA: Diagnosis not present

## 2015-07-04 DIAGNOSIS — Z13820 Encounter for screening for osteoporosis: Secondary | ICD-10-CM

## 2015-07-04 DIAGNOSIS — K219 Gastro-esophageal reflux disease without esophagitis: Secondary | ICD-10-CM

## 2015-07-04 DIAGNOSIS — Z0001 Encounter for general adult medical examination with abnormal findings: Secondary | ICD-10-CM

## 2015-07-04 DIAGNOSIS — Z716 Tobacco abuse counseling: Secondary | ICD-10-CM

## 2015-07-04 DIAGNOSIS — Z720 Tobacco use: Secondary | ICD-10-CM

## 2015-07-04 MED ORDER — VENLAFAXINE HCL ER 75 MG PO CP24: 75.0000 mg | ORAL_CAPSULE | Freq: Every day | ORAL | Status: DC

## 2015-07-04 NOTE — Progress Notes (Signed)
Subjective:    Patient ID: Lynn Bautista, female    DOB: April 28, 1955, 60 y.o.   MRN: [id number]  HPI  [name] is a 60 year old female who presents today for routine wellness.   Review of lab results with patient notes hyperlipidemia with report of inconsistently taking Lipitor as prescribed.  She reports having Lipitor prescribed for "many years" but also states that she has missed "many doses" or forgets to take it at all.  She states that she is now concerned about her cholesterol level and heart health and would like to address this today.  She denies chest pain, palpitations, SOB, leg cramps, or myalgias today.   GERD:  Currently taking omeprazole with decrease in dietary triggers which has improved symptoms of GERD.  Avoiding fat and tomato base products which she reports triggering heart burn for her.  While she has been prescribed omeprazole in the past by another provider, she reports rarely taking it in the past.  Omeprazole was recommended at her 05/13/2015 visit and she reports taking it less than 3 times since that visit.  She denies symptoms of GERD today. She is a current smoker.  Anxiety is well controlled with Effexor therapy. Patient denies any recent anxiety attacks and notes her mood as stable. She denies any adverse effects from this medication and would like to continue this medication at this time due to life stressors with family and recent daughter and granddaughter living with her.  Prior report of infrequent leg cramps is noted from her 05/13/2015 visit. Today, she denies any leg cramps or myalgias since that visit. She also reports drinking at least three 20 oz. Bottles of water per day and 2-3 glasses of water at night.    Exercise includes walking 1.5 to 2.0 miles per day and recent initiation of tennis lessons twice weekly.  She denies any cardiopulmonary symptoms with exercise.   Tobacco use has been noted as a 2 pack year history. She reports smoking at other  times in her life many years ago but did not quantify length of time or cigarette use at this time. She has been able to stop smoking previously and she states that she would like to stop smoking and is interested in information regarding tobacco cessation. She reports that some days she will have more cigarettes however did not quantify an amount but states that she consistently smokes 0.25 pack/day.    Diet includes a modified heart healthy diet with a reduction in triggers that stimulate symptoms of GERD.   She is interested in tobacco cessation and reports that she will contact her insurance company regarding coverage for nicotine patches or gum.   She is followed by a gynecologist for her pap and mammogram. She is currently due for a mammogram and bone density for which she would like arranged through this practice.   Patient is unsure of immunization record but has agreed to obtain these records from her previous provider.  She is due for a mammogram and will schedule this within the next 3 months per patient report. She is followed by gynecology.   Review of Systems  Constitutional: Negative for fever, chills and fatigue.  HENT: Negative for congestion, postnasal drip, rhinorrhea, sinus pressure and sneezing.   Eyes: Negative for redness, itching and visual disturbance.  Respiratory: Negative for cough, shortness of breath and wheezing.   Cardiovascular: Negative for chest pain, palpitations and leg swelling.  Gastrointestinal: Negative for nausea, vomiting, abdominal pain, diarrhea  and constipation.  Genitourinary: Negative for dysuria, urgency, frequency, hematuria and flank pain.  Musculoskeletal: Negative for myalgias, joint swelling and arthralgias.  Skin: Negative for rash.  Neurological: Negative for dizziness, light-headedness and headaches.  Psychiatric/Behavioral:       Denies depression or anxiety today. Reports her mood as "good" and anxiety well managed with Effexor  therapy.   Past Medical History  Diagnosis Date  . Anxiety   . GERD (gastroesophageal reflux disease)      Social History   Social History  . Marital Status: Divorced    Spouse Name: N/A  . Number of Children: 2  . Years of Education: N/A   Occupational History  . 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 Smoking status: Current Some Day Smoker -- 0.25 packs/day for 8 years  . Smokeless tobacco: Not on file  . Alcohol Use: 4.8 oz/week    8 Standard drinks or equivalent per week     Comment: social  . Drug Use: No  . Sexual Activity: Not on file   Other Topics Concern  . Not on file   Social History Narrative    Past Surgical History  Procedure Laterality Date  . Tonsillectomy    . Appendectomy    . Abdominal hysterectomy    . Cholecystectomy    . Bunionectomy Bilateral   . Breast ductal system excision Right 01/02/2015    Procedure: RIGHT BREAST CENTRAL DUCT EXCISION;  Surgeon: Erroll Luna, MD;  Location: [place];  Service: General;  Laterality: Right;    Family History  Problem Relation Age of Onset  . Cancer Mother     Uterine and Breast  . Heart attack Mother   . Hyperlipidemia Mother   . Cancer Father     prostate cancer  . Heart attack Brother 78  . Hyperlipidemia Brother   . Hyperlipidemia Daughter     No Known Allergies  Current Outpatient Prescriptions on File Prior to Visit  Medication Sig Dispense Refill  . omeprazole (PRILOSEC) 20 MG capsule Take 1 capsule (20 mg total) by mouth daily. 30 capsule 0  . atorvastatin (LIPITOR) 10 MG tablet Take 1 tablet (10 mg total) by mouth daily. 30 tablet 0   No current facility-administered medications on file prior to visit.    BP 128/88 mmHg  Pulse 78  Temp(Src) 97.8 F (36.6 C) (Oral)  Ht 5' 1.25" (1.556 m)  Wt 153 lb (69.4 kg)  BMI 28.66 kg/m2  SpO2 98%       Objective:   Physical Exam BP 120/92 mmHg  Pulse 78  Temp(Src) 97.8 F (36.6 C)  (Oral)  Ht 5' 1.25" (1.556 m)  Wt 153 lb (69.4 kg)  BMI 28.66 kg/m2  SpO2 98%  General Appearance:    Alert, cooperative, no distress, appears stated age  Head:    Normocephalic, without obvious abnormality, atraumatic  Eyes:    PERRL, conjunctiva/corneas clear, EOM's intact, fundi    benign, both eyes  Ears:    Normal TM's and external ear canals, both ears  Nose:   Nares normal, septum midline, mucosa normal, no drainage    or sinus tenderness  Throat:   Lips, mucosa, and tongue normal; teeth and gums normal  Neck:   Supple, symmetrical, trachea midline, no adenopathy;    thyroid:  no enlargement/tenderness/nodules; no carotid   bruit or JVD  Back:     Symmetric, no curvature, ROM normal, no CVA tenderness  Lungs:  Clear to auscultation bilaterally, respirations unlabored  Chest Wall:    No tenderness or deformity   Heart:    Regular rate and rhythm, S1 and S2 normal, no murmur, rub   or gallop  Breast Exam:    No tenderness, masses, or nipple abnormality  Abdomen:     Soft, non-tender, bowel sounds active all four quadrants,    no masses, no organomegaly  Genitalia:    Deferred. Followed by gynecology.  Rectal:    Deferred. Followed by gynecology.  Extremities:   Extremities normal, atraumatic, no cyanosis or edema  Pulses:   2+ and symmetric all extremities  Skin:   Skin color, texture, turgor normal, no rashes or lesions  Lymph nodes:   Cervical, supraclavicular, and axillary nodes normal  Neurologic:   CNII-XII intact, normal strength, sensation and reflexes    throughout      Assessment & Plan:  1. Encounter for general adult medical examination with abnormal findings Reviewed lab results with patient and discussed the importance of adhering to her medications and exercise  to reduce her LDL level.   2. Hyperlipidemia Continue Lipitor as prescribed with follow up in 6 months for evaluation of lipid level and response to medication.  Discussed importance of adherence  to medication regimen and dietary changes to improve cardiac health. Length of   3. Gastroesophageal reflux disease without esophagitis Avoidance of triggers discussed with information provided to patient of triggers to avoid. Advised short term use of omeprazole with follow up for evaluation of symptoms.  4. Anxiety Patient reports prior history of using XR 75 mg tablet which has provided excellent benefit. She has been taking 75 mg and did not take 75 mg BID of the non extended release tablet. Provided prescription today with follow up in 6 months for management and evaluation of symptoms of anxiety.  - venlafaxine XR (EFFEXOR XR) 75 MG 24 hr capsule; Take 1 capsule (75 mg total) by mouth daily with breakfast.  Dispense: 30 capsule; Refill: 3  5. Screening for osteoporosis History of hysterectomy noted. Patient has transferred care here and she cannot remember her last bone density screening.   - DG Bone Density; Future  6. Encounter for tobacco use cessation counseling Tobacco Cessation- I have discussed the risks of tobacco use with the patient, which includes but are not limited to lung, throat,stomach, pancreatic, and colon cancer,as well as COPD. The type and daily amount of tobacco used, as well as the patients willingness to quit were accessed. I discussed with the patient the different medical interventions, their usefulness, effectiveness and side effects. Pt states that she will contact her insurance company for coverage of nicotine patches or gum. She was advised that if a trial of nicotine patches or gum is not successful, other medical interventions can be considered. She will follow up in 6 months as indicated or sooner if she needs assistance with tobacco cessation method.    7. Screening for breast cancer  - MM Digital Screening; Future  Patient stated that she will obtain medical records from previous provider to update immunizations and other health maintenance  activities. Discussed Herpes Zoster vaccine with patient who declined today. Advised patient to contact insurance regarding coverage for Herpes Zoster vaccine coverage and if she desires this vaccine, RTC or she can also obtain this at a local pharmacy.  Delano Metz, FNP-C

## 2015-07-04 NOTE — Progress Notes (Signed)
Pre visit review using our clinic review tool, if applicable. No additional management support is needed unless otherwise documented below in the visit note. 

## 2015-07-04 NOTE — Patient Instructions (Signed)
A referral has been placed for your mammogram and bone density today. If you have not heard from our referral coordinator in 7 to 10 days, please contact the office for assistance.  Continue medication as directed and follow up in 6 months for lab work.  Great work with diet and exercise and taking the initiative to stop smoking.  Smoking Cessation, Tips for Success If you are ready to quit smoking, congratulations! You have chosen to help yourself be healthier. Cigarettes bring nicotine, tar, carbon monoxide, and other irritants into your body. Your lungs, heart, and blood vessels will be able to work better without these poisons. There are many different ways to quit smoking. Nicotine gum, nicotine patches, a nicotine inhaler, or nicotine nasal spray can help with physical craving. Hypnosis, support groups, and medicines help break the habit of smoking. WHAT THINGS CAN I DO TO MAKE QUITTING EASIER?  Here are some tips to help you quit for good:  Pick a date when you will quit smoking completely. Tell all of your friends and family about your plan to quit on that date.  Do not try to slowly cut down on the number of cigarettes you are smoking. Pick a quit date and quit smoking completely starting on that day.  Throw away all cigarettes.   Clean and remove all ashtrays from your home, work, and car.  On a card, write down your reasons for quitting. Carry the card with you and read it when you get the urge to smoke.  Cleanse your body of nicotine. Drink enough water and fluids to keep your urine clear or pale yellow. Do this after quitting to flush the nicotine from your body.  Learn to predict your moods. Do not let a bad situation be your excuse to have a cigarette. Some situations in your life might tempt you into wanting a cigarette.  Never have "just one" cigarette. It leads to wanting another and another. Remind yourself of your decision to quit.  Change habits associated with  smoking. If you smoked while driving or when feeling stressed, try other activities to replace smoking. Stand up when drinking your coffee. Brush your teeth after eating. Sit in a different chair when you read the paper. Avoid alcohol while trying to quit, and try to drink fewer caffeinated beverages. Alcohol and caffeine may urge you to smoke.  Avoid foods and drinks that can trigger a desire to smoke, such as sugary or spicy foods and alcohol.  Ask people who smoke not to smoke around you.  Have something planned to do right after eating or having a cup of coffee. For example, plan to take a walk or exercise.  Try a relaxation exercise to calm you down and decrease your stress. Remember, you may be tense and nervous for the first 2 weeks after you quit, but this will pass.  Find new activities to keep your hands busy. Play with a pen, coin, or rubber band. Doodle or draw things on paper.  Brush your teeth right after eating. This will help cut down on the craving for the taste of tobacco after meals. You can also try mouthwash.   Use oral substitutes in place of cigarettes. Try using lemon drops, carrots, cinnamon sticks, or chewing gum. Keep them handy so they are available when you have the urge to smoke.  When you have the urge to smoke, try deep breathing.  Designate your home as a nonsmoking area.  If you are a heavy smoker,  ask your health care provider about a prescription for nicotine chewing gum. It can ease your withdrawal from nicotine.  Reward yourself. Set aside the cigarette money you save and buy yourself something nice.  Look for support from others. Join a support group or smoking cessation program. Ask someone at home or at work to help you with your plan to quit smoking.  Always ask yourself, "Do I need this cigarette or is this just a reflex?" Tell yourself, "Today, I choose not to smoke," or "I do not want to smoke." You are reminding yourself of your decision to  quit.  Do not replace cigarette smoking with electronic cigarettes (commonly called e-cigarettes). The safety of e-cigarettes is unknown, and some may contain harmful chemicals.  If you relapse, do not give up! Plan ahead and think about what you will do the next time you get the urge to smoke. HOW WILL I FEEL WHEN I QUIT SMOKING? You may have symptoms of withdrawal because your body is used to nicotine (the addictive substance in cigarettes). You may crave cigarettes, be irritable, feel very hungry, cough often, get headaches, or have difficulty concentrating. The withdrawal symptoms are only temporary. They are strongest when you first quit but will go away within 10-14 days. When withdrawal symptoms occur, stay in control. Think about your reasons for quitting. Remind yourself that these are signs that your body is healing and getting used to being without cigarettes. Remember that withdrawal symptoms are easier to treat than the major diseases that smoking can cause.  Even after the withdrawal is over, expect periodic urges to smoke. However, these cravings are generally short lived and will go away whether you smoke or not. Do not smoke! WHAT RESOURCES ARE AVAILABLE TO HELP ME QUIT SMOKING? Your health care provider can direct you to community resources or hospitals for support, which may include:  Group support.  Education.  Hypnosis.  Therapy.   This information is not intended to replace advice given to you by your health care provider. Make sure you discuss any questions you have with your health care provider.   Document Released: 10/18/2003 Document Revised: 02/09/2014 Document Reviewed: 07/07/2012 Elsevier Interactive Patient Education [employer].

## 2015-07-07 ENCOUNTER — Other Ambulatory Visit: Payer: Self-pay | Admitting: Family Medicine

## 2015-07-10 NOTE — Telephone Encounter (Signed)
Pharmacy called to follow up on rx request omeprazole (PRILOSEC) 20 MG capsule  [facility]

## 2015-07-11 NOTE — Telephone Encounter (Signed)
Rx refill sent to pharmacy. 

## 2015-09-10 ENCOUNTER — Other Ambulatory Visit: Payer: Self-pay | Admitting: Family Medicine

## 2015-09-10 MED ORDER — OMEPRAZOLE 20 MG PO CPDR: DELAYED_RELEASE_CAPSULE | ORAL | 1 refills | Status: DC

## 2015-09-10 NOTE — Telephone Encounter (Signed)
Pt requesting a 90 day supply of [facility]ep[facility] to [facility] and I did send script e-scribe.

## 2015-09-16 ENCOUNTER — Ambulatory Visit (HOSPITAL_BASED_OUTPATIENT_CLINIC_OR_DEPARTMENT_OTHER)
Admission: RE | Admit: 2015-09-16 | Discharge: 2015-09-16 | Disposition: A | Discharge status: Home / Self Care | Payer: Managed Care, Other (non HMO) | Source: Ambulatory Visit | Attending: Family Medicine | Admitting: Family Medicine

## 2015-09-16 DIAGNOSIS — Z1239 Encounter for other screening for malignant neoplasm of breast: Secondary | ICD-10-CM | POA: Insufficient documentation

## 2015-09-16 DIAGNOSIS — Z1231 Encounter for screening mammogram for malignant neoplasm of breast: Secondary | ICD-10-CM | POA: Insufficient documentation

## 2015-09-16 DIAGNOSIS — Z13820 Encounter for screening for osteoporosis: Secondary | ICD-10-CM | POA: Diagnosis present

## 2015-09-16 DIAGNOSIS — Z780 Asymptomatic menopausal state: Secondary | ICD-10-CM | POA: Insufficient documentation

## 2015-09-16 DIAGNOSIS — M85851 Other specified disorders of bone density and structure, right thigh: Secondary | ICD-10-CM | POA: Insufficient documentation

## 2015-09-18 ENCOUNTER — Telehealth: Payer: Self-pay | Admitting: Family Medicine

## 2015-09-18 NOTE — Telephone Encounter (Signed)
Pt returning the call and would like to have someone that will talk slower when leaving a msg.  Have called the office several times and was not able to get through.

## 2015-09-18 NOTE — Telephone Encounter (Signed)
Called and spoke with pt. Informed her of her results.

## 2015-09-26 ENCOUNTER — Encounter: Payer: Self-pay | Admitting: Family Medicine

## 2015-09-27 ENCOUNTER — Encounter: Payer: Self-pay | Admitting: Family Medicine

## 2015-10-28 ENCOUNTER — Ambulatory Visit (INDEPENDENT_AMBULATORY_CARE_PROVIDER_SITE_OTHER): Payer: Managed Care, Other (non HMO) | Admitting: Family Medicine

## 2015-10-28 ENCOUNTER — Encounter: Payer: Self-pay | Admitting: Family Medicine

## 2015-10-28 VITALS — BP 128/82 | Temp 98.2°F | Ht 61.5 in | Wt 155.0 lb

## 2015-10-28 DIAGNOSIS — J029 Acute pharyngitis, unspecified: Secondary | ICD-10-CM | POA: Diagnosis not present

## 2015-10-28 LAB — POCT RAPID STREP A (OFFICE): RAPID STREP A SCREEN: NEGATIVE

## 2015-10-28 NOTE — Progress Notes (Signed)
Pre visit review using our clinic review tool, if applicable. No additional management support is needed unless otherwise documented below in the visit note. 

## 2015-10-28 NOTE — Patient Instructions (Signed)
Please use tylenol or ibuprofen as needed for discomfort.  If symptoms do not improve, worsen, or you develop any new symptoms or fever >100, please follow up for further evaluation and treatment.  It was a pleasure to see you today! Please feel better soon and let me know if you need anything.  Sore Throat A sore throat is pain, burning, irritation, or scratchiness of the throat. There is often pain or tenderness when swallowing or talking. A sore throat may be accompanied by other symptoms, such as coughing, sneezing, fever, and swollen neck glands. A sore throat is often the first sign of another sickness, such as a cold, flu, strep throat, or mononucleosis (commonly known as mono). Most sore throats go away without medical treatment. CAUSES  The most common causes of a sore throat include:  A viral infection, such as a cold, flu, or mono.  A bacterial infection, such as strep throat, tonsillitis, or whooping cough.  Seasonal allergies.  Dryness in the air.  Irritants, such as smoke or pollution.  Gastroesophageal reflux disease (GERD). HOME CARE INSTRUCTIONS   Only take over-the-counter medicines as directed by your caregiver.  Drink enough fluids to keep your urine clear or pale yellow.  Rest as needed.  Try using throat sprays, lozenges, or sucking on hard candy to ease any pain (if older than 4 years or as directed).  Sip warm liquids, such as broth, herbal tea, or warm water with honey to relieve pain temporarily. You may also eat or drink cold or frozen liquids such as frozen ice pops.  Gargle with salt water (mix 1 tsp salt with 8 oz of water).  Do not smoke and avoid secondhand smoke.  Put a cool-mist humidifier in your bedroom at night to moisten the air. You can also turn on a hot shower and sit in the bathroom with the door closed for 5-10 minutes. SEEK IMMEDIATE MEDICAL CARE IF:  You have difficulty breathing.  You are unable to swallow fluids, soft foods, or  your saliva.  You have increased swelling in the throat.  Your sore throat does not get better in 7 days.  You have nausea and vomiting.  You have a fever or persistent symptoms for more than 2-3 days.  You have a fever and your symptoms suddenly get worse. MAKE SURE YOU:   Understand these instructions.  Will watch your condition.  Will get help right away if you are not doing well or get worse.   This information is not intended to replace advice given to you by your health care provider. Make sure you discuss any questions you have with your health care provider.   Document Released: 02/27/2004 Document Revised: 02/09/2014 Document Reviewed: 09/27/2011 Elsevier Interactive Patient Education [employer].

## 2015-10-28 NOTE — Progress Notes (Signed)
Subjective:    Patient ID: Lynn Bautista, female    DOB: 1955/07/15, 60 y.o.   MRN: [id number]  HPI  [name] is a 60 year old female who presents today with a sore throat for 3 days. Associated symptoms of post nasal drip, headache and , fatigue are noted at night.  Denies fever, chills, sweats, N/V/D, ear pressure/pain, sinus pressure/pain, cough, SOB, chest pain or palpitations  She denies recent antibiotic use but reports  recent sick contacts at work. She is UTD with her influenza vaccine. She is a current smoker. Denies history of asthma/bronchitis Tylenol and throat lozenges have provided limited benefit.  Review of Systems  Constitutional: Positive for fatigue. Negative for chills and fever.  HENT: Positive for postnasal drip and sore throat. Negative for ear pain and sinus pressure.   Respiratory: Negative for cough, shortness of breath and wheezing.   Cardiovascular: Negative for chest pain and palpitations.  Gastrointestinal: Negative for abdominal pain, diarrhea, nausea and vomiting.  Musculoskeletal: Negative for myalgias.  Neurological: Negative for dizziness, light-headedness and headaches.   Past Medical History:  Diagnosis Date  . Anxiety   . GERD (gastroesophageal reflux disease)      Social History   Social History  . Marital status: Divorced    Spouse name: N/A  . Number of children: 2  . Years of education: N/A   Occupational History  . 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 Smoking status: Current Some Day Smoker    Packs/day: 0.25    Years: 8.00  . Smokeless tobacco: Not on file  . Alcohol use 4.8 oz/week    8 Standard drinks or equivalent per week     Comment: social  . Drug use: No  . Sexual activity: Not on file   Other Topics Concern  . Not on file   Social History Narrative  . No narrative on file    Past Surgical History:  Procedure Laterality Date  . ABDOMINAL HYSTERECTOMY    . APPENDECTOMY    .  BREAST DUCTAL SYSTEM EXCISION Right 01/02/2015   Procedure: RIGHT BREAST CENTRAL DUCT EXCISION;  Surgeon: Erroll Luna, MD;  Location: [place];  Service: General;  Laterality: Right;  . BUNIONECTOMY Bilateral   . CHOLECYSTECTOMY    . TONSILLECTOMY      Family History  Problem Relation Age of Onset  . Cancer Mother     Uterine and Breast  . Heart attack Mother   . Hyperlipidemia Mother   . Cancer Father     prostate cancer  . Heart attack Brother 68  . Hyperlipidemia Brother   . Hyperlipidemia Daughter     No Known Allergies  Current Outpatient Prescriptions on File Prior to Visit  Medication Sig Dispense Refill  . atorvastatin (LIPITOR) 10 MG tablet Take 1 tablet (10 mg total) by mouth daily. 30 tablet 0  . omeprazole (PRILOSEC) 20 MG capsule TAKE 1 CAPSULE (20 MG TOTAL) BY MOUTH DAILY. 90 capsule 1  . venlafaxine XR (EFFEXOR XR) 75 MG 24 hr capsule Take 1 capsule (75 mg total) by mouth daily with breakfast. 30 capsule 3   No current facility-administered medications on file prior to visit.     BP 128/82 (BP Location: Left Arm, Patient Position: Sitting, Cuff Size: Normal)   Temp 98.2 F (36.8 C) (Oral)   Ht 5' 1.5" (1.562 m)   Wt 155 lb (70.3 kg)   BMI 28.81 kg/m  Objective:   Physical Exam  Constitutional: She is oriented to person, place, and time. She appears well-developed and well-nourished.  HENT:  Right Ear: Tympanic membrane normal.  Left Ear: Tympanic membrane normal.  Nose: Right sinus exhibits no maxillary sinus tenderness and no frontal sinus tenderness. Left sinus exhibits no maxillary sinus tenderness and no frontal sinus tenderness.  Mouth/Throat: Mucous membranes are normal. Posterior oropharyngeal erythema present. No oropharyngeal exudate.  Eyes: Pupils are equal, round, and reactive to light. No scleral icterus.  Neck: Neck supple.  Cardiovascular: Normal rate and regular rhythm.   Pulmonary/Chest: Effort normal and  breath sounds normal. She has no wheezes. She has no rales.  Abdominal: Soft. Bowel sounds are normal. There is no tenderness.  Lymphadenopathy:    She has cervical adenopathy.  Neurological: She is alert and oriented to person, place, and time.  Skin: Skin is warm and dry. No rash noted.  Psychiatric: She has a normal mood and affect. Her behavior is normal. Judgment and thought content normal.       Assessment & Plan:  1. Sore throat Rapid strep negative. Sore throat is most likely due to a viral illness. Advised supportive measures of tylenol and ibuprofen short term if needed and  salt water gargles for comfort. Further advised her to follow up if symptoms do not improve in 3 to 4 days, worsen, or she develops a fever >100. Patient voiced understanding and agreed with plan. - POCT rapid strep A  Delano Metz, FNP-C

## 2015-11-13 ENCOUNTER — Other Ambulatory Visit: Payer: Self-pay | Admitting: Family Medicine

## 2015-11-13 DIAGNOSIS — F419 Anxiety disorder, unspecified: Secondary | ICD-10-CM

## 2015-12-16 ENCOUNTER — Other Ambulatory Visit: Payer: Self-pay

## 2015-12-16 ENCOUNTER — Other Ambulatory Visit: Payer: Self-pay | Admitting: Family Medicine

## 2015-12-16 DIAGNOSIS — F419 Anxiety disorder, unspecified: Secondary | ICD-10-CM

## 2015-12-16 MED ORDER — VENLAFAXINE HCL ER 75 MG PO CP24: 75.0000 mg | ORAL_CAPSULE | Freq: Every day | ORAL | 3 refills | Status: DC

## 2016-01-17 ENCOUNTER — Other Ambulatory Visit: Payer: Self-pay

## 2016-01-17 DIAGNOSIS — F419 Anxiety disorder, unspecified: Secondary | ICD-10-CM

## 2016-01-17 MED ORDER — VENLAFAXINE HCL ER 75 MG PO CP24: 75.0000 mg | ORAL_CAPSULE | Freq: Every day | ORAL | 3 refills | Status: DC

## 2016-01-20 ENCOUNTER — Ambulatory Visit (INDEPENDENT_AMBULATORY_CARE_PROVIDER_SITE_OTHER): Payer: Managed Care, Other (non HMO) | Admitting: Family Medicine

## 2016-01-20 ENCOUNTER — Encounter: Payer: Self-pay | Admitting: Family Medicine

## 2016-01-20 VITALS — BP 128/90 | HR 94 | Temp 99.4°F | Wt 152.6 lb

## 2016-01-20 DIAGNOSIS — R05 Cough: Secondary | ICD-10-CM

## 2016-01-20 DIAGNOSIS — R52 Pain, unspecified: Secondary | ICD-10-CM

## 2016-01-20 DIAGNOSIS — J101 Influenza due to other identified influenza virus with other respiratory manifestations: Secondary | ICD-10-CM | POA: Diagnosis not present

## 2016-01-20 DIAGNOSIS — R059 Cough, unspecified: Secondary | ICD-10-CM

## 2016-01-20 LAB — POCT INFLUENZA A: Rapid Influenza A Ag: POSITIVE

## 2016-01-20 MED ORDER — OSELTAMIVIR PHOSPHATE 75 MG PO CAPS: 75.0000 mg | ORAL_CAPSULE | Freq: Two times a day (BID) | ORAL | 0 refills | Status: DC

## 2016-01-20 MED ORDER — BENZONATATE 100 MG PO CAPS: 100.0000 mg | ORAL_CAPSULE | Freq: Three times a day (TID) | ORAL | 0 refills | Status: DC

## 2016-01-20 MED ORDER — HYDROCODONE-HOMATROPINE 5-1.5 MG/5ML PO SYRP: 5.0000 mL | ORAL_SOLUTION | Freq: Three times a day (TID) | ORAL | 0 refills | Status: DC | PRN

## 2016-01-20 NOTE — Progress Notes (Signed)
Subjective:    Patient ID: Lynn Bautista, female    DOB: [birth year], 60 y.o.   MRN: [id number]  HPI  [name] is a 60 year old who presents today with body nonproductive cough that started 2 days ago.  Associated chills, sweats, post nasal drip, aches, and sore throat. She denies sinus pressure/pain, rhinitis, nasal congestion,  Treatment at home with ibuprofen, Mucinex DM, OTC cold medication,  Recent sick contact exposure No recent antibiotic use No history of asthma; history of 2 episodes of bronchitis per patient report She is UTD with her influenza vaccine.  Review of Systems  Constitutional: Positive for chills and fever.  HENT: Positive for postnasal drip and sore throat. Negative for ear pain, rhinorrhea, sinus pain and sinus pressure.   Respiratory: Positive for cough. Negative for shortness of breath and wheezing.   Gastrointestinal: Negative for abdominal pain, constipation, diarrhea, nausea and vomiting.  Musculoskeletal: Positive for myalgias.  Skin: Negative for rash.  Neurological: Negative for dizziness, light-headedness and numbness.   Past Medical History:  Diagnosis Date  . Anxiety   . GERD (gastroesophageal reflux disease)      Social History   Social History  . Marital status: Divorced    Spouse name: N/A  . Number of children: 2  . Years of education: N/A   Occupational History  . 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 Smoking status: Current Some Day Smoker    Packs/day: 0.25    Years: 8.00  . Smokeless tobacco: Not on file  . Alcohol use 4.8 oz/week    8 Standard drinks or equivalent per week     Comment: social  . Drug use: No  . Sexual activity: Not on file   Other Topics Concern  . Not on file   Social History Narrative  . No narrative on file    Past Surgical History:  Procedure Laterality Date  . ABDOMINAL HYSTERECTOMY    . APPENDECTOMY    . BREAST DUCTAL SYSTEM EXCISION Right 01/02/2015   Procedure: RIGHT BREAST CENTRAL DUCT EXCISION;  Surgeon: Erroll Luna, MD;  Location: [facility];  Service: General;  Laterality: Right;  . BUNIONECTOMY Bilateral   . CHOLECYSTECTOMY    . TONSILLECTOMY      Family History  Problem Relation Age of Onset  . Cancer Mother     Uterine and Breast  . Heart attack Mother   . Hyperlipidemia Mother   . Cancer Father     prostate cancer  . Heart attack Brother 52  . Hyperlipidemia Brother   . Hyperlipidemia Daughter     No Known Allergies  Current Outpatient Prescriptions on File Prior to Visit  Medication Sig Dispense Refill  . omeprazole (PRILOSEC) 20 MG capsule TAKE 1 CAPSULE (20 MG TOTAL) BY MOUTH DAILY. 90 capsule 1  . venlafaxine XR (EFFEXOR-XR) 75 MG 24 hr capsule Take 1 capsule (75 mg total) by mouth daily with breakfast. 30 capsule 3  . atorvastatin (LIPITOR) 10 MG tablet Take 1 tablet (10 mg total) by mouth daily. 30 tablet 0   No current facility-administered medications on file prior to visit.     BP 128/90 (BP Location: Left Arm, Patient Position: Sitting, Cuff Size: Normal)   Pulse 94   Temp 99.4 F (37.4 C) (Oral)   Wt 152 lb 9.6 oz (69.2 kg)   SpO2 97%   BMI 28.37 kg/m        Objective:  Physical Exam  Constitutional: She is oriented to person, place, and time. She appears well-developed.  HENT:  Right Ear: Tympanic membrane normal.  Left Ear: Tympanic membrane normal.  Nose: No rhinorrhea. Right sinus exhibits no maxillary sinus tenderness and no frontal sinus tenderness. Left sinus exhibits no maxillary sinus tenderness and no frontal sinus tenderness.  Mouth/Throat: Mucous membranes are normal. No oropharyngeal exudate or posterior oropharyngeal erythema.  Eyes: Pupils are equal, round, and reactive to light.  Neck: Neck supple.  Cardiovascular: Normal rate and regular rhythm.   Pulmonary/Chest: Effort normal and breath sounds normal. She has no wheezes. She has no rales.    Lymphadenopathy:    She has cervical adenopathy.  Neurological: She is alert and oriented to person, place, and time.  Skin: Skin is warm and dry. No rash noted.       Assessment & Plan:  1. Influenza A Advised patient on supportive measures:  Get rest, drink plenty of fluids, and use tylenol needed for fever. Follow up if fever >101, if symptoms worsen or if symptoms are not improved in 3 to 4  days. Patient verbalizes understanding.   - oseltamivir (TAMIFLU) 75 MG capsule; Take 1 capsule (75 mg total) by mouth 2 (two) times daily.  Dispense: 10 capsule; Refill: 0  2. Body aches POC influenza +; tamiflu and supportive measures - POCT Influenza A  3. Cough  - HYDROcodone-homatropine (HYCODAN) 5-1.5 MG/5ML syrup; Take 5 mLs by mouth every 8 (eight) hours as needed for cough.  Dispense: 120 mL; Refill: 0 - benzonatate (TESSALON) 100 MG capsule; Take 1 capsule (100 mg total) by mouth 3 (three) times daily.  Dispense: 20 capsule; Refill: 0  Advised use of Allegra, Claritin, or Zyrtec as needed for post nasal drip.  Return precautions advised.  Delano Metz, FNP-C

## 2016-01-20 NOTE — Progress Notes (Signed)
Pre visit review using our clinic review tool, if applicable. No additional management support is needed unless otherwise documented below in the visit note. 

## 2016-01-20 NOTE — Patient Instructions (Signed)
Please use tylenol for fever and you may use Allegra, Claritin, or Zyrtec for post nasal drip.  Also, please take tamiflu as provided.   Influenza, Adult Influenza ("the flu") is an infection in the lungs, nose, and throat (respiratory tract). It is caused by a virus. The flu causes many common cold symptoms, as well as a high fever and body aches. It can make you feel very sick. The flu spreads easily from person to person (is contagious). Getting a flu shot (influenza vaccination) every year is the best way to prevent the flu. Follow these instructions at home:  Take over-the-counter and prescription medicines only as told by your doctor.  Use a cool mist humidifier to add moisture (humidity) to the air in your home. This can make it easier to breathe.  Rest as needed.  Drink enough fluid to keep your pee (urine) clear or pale yellow.  Cover your mouth and nose when you cough or sneeze.  Wash your hands with soap and water often, especially after you cough or sneeze. If you cannot use soap and water, use hand sanitizer.  Stay home from work or school as told by your doctor. Unless you are visiting your doctor, try to avoid leaving home until your fever has been gone for 24 hours without the use of medicine.  Keep all follow-up visits as told by your doctor. This is important. How is this prevented?  Getting a yearly (annual) flu shot is the best way to avoid getting the flu. You may get the flu shot in late summer, fall, or winter. Ask your doctor when you should get your flu shot.  Wash your hands often or use hand sanitizer often.  Avoid contact with people who are sick during cold and flu season.  Eat healthy foods.  Drink plenty of fluids.  Get enough sleep.  Exercise regularly. Contact a doctor if:  You get new symptoms.  You have:  Chest pain.  Watery poop (diarrhea).  A fever.  Your cough gets worse.  You start to have more mucus.  You feel sick to your  stomach (nauseous).  You throw up (vomit). Get help right away if:  You start to be short of breath or have trouble breathing.  Your skin or nails turn a bluish color.  You have very bad pain or stiffness in your neck.  You get a sudden headache.  You get sudden pain in your face or ear.  You cannot stop throwing up. This information is not intended to replace advice given to you by your health care provider. Make sure you discuss any questions you have with your health care provider. Document Released: 10/29/2007 Document Revised: 06/27/2015 Document Reviewed: 11/13/2014 Elsevier Interactive Patient Education  2017 [employer].

## 2016-02-07 ENCOUNTER — Telehealth: Payer: Self-pay | Admitting: Family Medicine

## 2016-02-07 NOTE — Telephone Encounter (Signed)
Called the patient waiting for a call back in the clinic.

## 2016-02-07 NOTE — Telephone Encounter (Signed)
[facility] Client [facility] Patient Name: Lynn Bautista DOB: 07-Jan-1956 Initial Comment large lump on back of right shoulder, no painful to the touch Nurse Assessment Nurse: Andria Frames, RN, Aeriel Date/Time (Eastern Time): 02/07/2016 1:01:39 PM Confirm and document reason for call. If symptomatic, describe symptoms. ---Caller states, she has a large lump on the back of the left shoulder. It is about 3 inches in diameter. Caller denies redness or pain. Caller states, she wouldn't have noticed it if someone hadn't been rubbing her back. Caller denies fever and recent injury to the shoulder. Does the patient have any new or worsening symptoms? ---Yes Will a triage be completed? ---Yes Related visit to physician within the last 2 weeks? ---No Does the PT have any chronic conditions? (i.e. diabetes, asthma, etc.) ---Yes List chronic conditions. ---panic attacks, high cholesterol Is this a behavioral health or substance abuse call? ---No Guidelines Guideline Title Affirmed Question Affirmed Notes Skin Lump or Localized Swelling [1] Small swelling or lump AND [2] unexplained AND [3] present < 1 week (all triage qestions negative) Final Disposition User See PCP When Office is Open (within 3 days) Hensel, RN, Aeriel Referrals REFERRED TO PCP OFFICE REFERRED TO PCP OFFICE Disagree/Comply: Comply

## 2016-02-07 NOTE — Telephone Encounter (Signed)
Patient has an appointment with Dr Colin Benton on 02/10/2016.

## 2016-02-10 ENCOUNTER — Ambulatory Visit (INDEPENDENT_AMBULATORY_CARE_PROVIDER_SITE_OTHER): Payer: Commercial Managed Care - PPO | Admitting: Family Medicine

## 2016-02-10 ENCOUNTER — Encounter: Payer: Self-pay | Admitting: Family Medicine

## 2016-02-10 VITALS — BP 112/72 | HR 74 | Temp 97.8°F | Ht 61.5 in | Wt 157.1 lb

## 2016-02-10 DIAGNOSIS — R222 Localized swelling, mass and lump, trunk: Secondary | ICD-10-CM | POA: Diagnosis not present

## 2016-02-10 NOTE — Patient Instructions (Signed)
-  We placed a referral for you as discussed to the surgery office. It usually takes about 1-2 weeks to process and schedule this referral. If you have not heard from [place] regarding this appointment in 2 weeks please contact our office.

## 2016-02-10 NOTE — Progress Notes (Signed)
Pre visit review using our clinic review tool, if applicable. No additional management support is needed unless otherwise documented below in the visit note. 

## 2016-02-10 NOTE — Progress Notes (Signed)
HPI:  Lynn Bautista is a pleasant 61 yo here for an acute visit for:  A lump on the upper R back: -a friend noticed it last week -no pain, fevers, malaise, redness -she thinks it just suddenly appears as she does not remember having it before   ROS: See pertinent positives and negatives per HPI.  Past Medical History:  Diagnosis Date  . Anxiety   . GERD (gastroesophageal reflux disease)     Past Surgical History:  Procedure Laterality Date  . ABDOMINAL HYSTERECTOMY    . APPENDECTOMY    . BREAST DUCTAL SYSTEM EXCISION Right 01/02/2015   Procedure: RIGHT BREAST CENTRAL DUCT EXCISION;  Surgeon: Erroll Luna, MD;  Location: [facility];  Service: General;  Laterality: Right;  . BUNIONECTOMY Bilateral   . CHOLECYSTECTOMY    . TONSILLECTOMY      Family History  Problem Relation Age of Onset  . Cancer Mother     Uterine and Breast  . Heart attack Mother   . Hyperlipidemia Mother   . Cancer Father     prostate cancer  . Heart attack Brother 71  . Hyperlipidemia Brother   . Hyperlipidemia Daughter     Social History   Social History  . Marital status: Divorced    Spouse name: N/A  . Number of children: 2  . Years of education: N/A   Occupational History  . 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 Smoking status: Former Smoker    Packs/day: 0.25    Years: 8.00    Quit date: 01/17/2016  . Smokeless tobacco: None  . Alcohol use 4.8 oz/week    8 Standard drinks or equivalent per week     Comment: social  . Drug use: No  . Sexual activity: Not Asked   Other Topics Concern  . None   Social History Narrative  . None     Current Outpatient Prescriptions:  .  omeprazole (PRILOSEC) 20 MG capsule, TAKE 1 CAPSULE (20 MG TOTAL) BY MOUTH DAILY., Disp: 90 capsule, Rfl: 1 .  venlafaxine XR (EFFEXOR-XR) 75 MG 24 hr capsule, Take 1 capsule (75 mg total) by mouth daily with breakfast., Disp: 30 capsule, Rfl: 3 .   atorvastatin (LIPITOR) 10 MG tablet, Take 1 tablet (10 mg total) by mouth daily., Disp: 30 tablet, Rfl: 0  EXAM:  Vitals:   02/10/16 0807  BP: 112/72  Pulse: 74  Temp: 97.8 F (36.6 C)    Body mass index is 29.2 kg/m.  GENERAL: vitals reviewed and listed above, alert, oriented, appears well hydrated and in no acute distress  HEENT: atraumatic, conjunttiva clear, no obvious abnormalities on inspection of external nose and ears  NECK: no obvious masses on inspection  BACK: approx 5-6 cm and diameter soft mass in the R upper back  PSYCH: pleasant and cooperative, no obvious depression or anxiety  ASSESSMENT AND PLAN:  Discussed the following assessment and plan:  Mass on back - Plan: Ambulatory referral to General Surgery  -fairly large - query lipoma vs cyst vs other -opted for gen surgery eval for removal - referral instructions below -Patient advised to return or notify a doctor immediately if symptoms worsen or persist or new concerns arise.  Patient Instructions  -We placed a referral for you as discussed to the surgery office. It usually takes about 1-2 weeks to process and schedule this referral. If you have not heard from [place] regarding this appointment in 2 weeks  please contact our office.    Colin Benton R., DO

## 2016-02-26 ENCOUNTER — Ambulatory Visit (INDEPENDENT_AMBULATORY_CARE_PROVIDER_SITE_OTHER): Payer: Commercial Managed Care - PPO | Admitting: Family Medicine

## 2016-02-26 VITALS — BP 130/70 | HR 104 | Temp 98.7°F | Ht 61.5 in | Wt 154.0 lb

## 2016-02-26 DIAGNOSIS — J029 Acute pharyngitis, unspecified: Secondary | ICD-10-CM

## 2016-02-26 LAB — POCT RAPID STREP A (OFFICE): RAPID STREP A SCREEN: NEGATIVE

## 2016-02-26 MED ORDER — AMOXICILLIN 500 MG PO CAPS: 500.0000 mg | ORAL_CAPSULE | Freq: Three times a day (TID) | ORAL | 0 refills | Status: DC

## 2016-02-26 NOTE — Progress Notes (Signed)
Subjective:     Patient ID: Lynn Bautista, female   DOB: 1956-01-17, 61 y.o.   MRN: [id number]  HPI Acute visit. 2 day history of right ear pain, sore throat, cough. She denies any nasal congestion. She's had body aches and chills and intermittent sweats. She was around niece who might have had strep throat recently. No skin rash. No nausea or vomiting.  Past Medical History:  Diagnosis Date  . Anxiety   . GERD (gastroesophageal reflux disease)    Past Surgical History:  Procedure Laterality Date  . ABDOMINAL HYSTERECTOMY    . APPENDECTOMY    . BREAST DUCTAL SYSTEM EXCISION Right 01/02/2015   Procedure: RIGHT BREAST CENTRAL DUCT EXCISION;  Surgeon: Erroll Luna, MD;  Location: [facility];  Service: General;  Laterality: Right;  . BUNIONECTOMY Bilateral   . CHOLECYSTECTOMY    . TONSILLECTOMY      reports that she quit smoking about 5 weeks ago. She has a 2.00 pack-year smoking history. She does not have any smokeless tobacco history on file. She reports that she drinks about 4.8 oz of alcohol per week . She reports that she does not use drugs. family history includes Cancer in her father and mother; Heart attack in her mother; Heart attack (age of onset: 77) in her brother; Hyperlipidemia in her brother, daughter, and mother. No Known Allergies   Review of Systems  Constitutional: Positive for chills and fatigue.  HENT: Positive for sore throat.   Respiratory: Positive for cough. Negative for shortness of breath and wheezing.   Gastrointestinal: Negative for nausea and vomiting.  Neurological: Positive for headaches.       Objective:   Physical Exam  Constitutional: She appears well-developed and well-nourished.  HENT:  Right Ear: External ear normal.  Left Ear: External ear normal.  Mouth/Throat: No oropharyngeal exudate.  Posterior pharynx erythema. No exudate  Neck: Neck supple.  Tender anterior cervical nodes right greater than left  Pulmonary/Chest:  Effort normal and breath sounds normal. No respiratory distress. She has no wheezes. She has no rales.  Skin: No rash noted.       Assessment:     Sore throat. Differential is viral versus group A strep. She does have lack of nasal symptoms which could go along with strep and also possible recent exposure    Plan:     -Check rapid strep screen.  This was negative-but we have some concern this may still be Group A strep with lack of nasal congestion and possible recent exposure to strep. -We elected to go ahead and cover with Amoxicillin 500 mg po tid times 10 days.  Eulas Post MD [facility]

## 2016-02-26 NOTE — Patient Instructions (Signed)

## 2016-02-26 NOTE — Progress Notes (Signed)
Pre visit review using our clinic review tool, if applicable. No additional management support is needed unless otherwise documented below in the visit note. 

## 2016-03-17 ENCOUNTER — Encounter: Payer: Self-pay | Admitting: Family Medicine

## 2016-03-17 ENCOUNTER — Ambulatory Visit (INDEPENDENT_AMBULATORY_CARE_PROVIDER_SITE_OTHER): Payer: Commercial Managed Care - PPO | Admitting: Family Medicine

## 2016-03-17 VITALS — BP 132/88 | HR 90 | Temp 98.2°F | Ht 61.5 in | Wt 154.8 lb

## 2016-03-17 DIAGNOSIS — J069 Acute upper respiratory infection, unspecified: Secondary | ICD-10-CM | POA: Diagnosis not present

## 2016-03-17 DIAGNOSIS — B9789 Other viral agents as the cause of diseases classified elsewhere: Secondary | ICD-10-CM

## 2016-03-17 NOTE — Patient Instructions (Addendum)
INSTRUCTIONS FOR UPPER RESPIRATORY INFECTION:  -plenty of rest and fluids  -nasal saline wash 2-3 times daily (use prepackaged nasal saline or bottled/distilled water if making your own)   -can use AFRIN nasal spray for drainage and nasal congestion - but do NOT use longer then 3-4 days  -can use tylenol (in no history of liver disease) or ibuprofen (if no history of kidney disease, bowel bleeding or significant heart disease) as directed for aches and sorethroat  -in the winter time, using a humidifier at night is helpful (please follow cleaning instructions)  -if you are taking a cough medication - use only as directed, may also try a teaspoon of honey to coat the throat and throat lozenges.  -for sore throat, salt water gargles can help  -follow up if you have fevers, facial pain, tooth pain, difficulty breathing or are worsening or symptoms persist longer then expected  Upper Respiratory Infection, Adult An upper respiratory infection (URI) is also known as the common cold. It is often caused by a type of germ (virus). Colds are easily spread (contagious). You can pass it to others by kissing, coughing, sneezing, or drinking out of the same glass. Usually, you get better in 1 to 3  weeks.  However, the cough can last for even longer. HOME CARE   Only take medicine as told by your doctor. Follow instructions provided above.  Drink enough water and fluids to keep your pee (urine) clear or pale yellow.  Get plenty of rest.  Return to work when your temperature is < 100 for 24 hours or as told by your doctor. You may use a face mask and wash your hands to stop your cold from spreading. GET HELP RIGHT AWAY IF:   After the first few days, you feel you are getting worse.  You have questions about your medicine.  You have chills, shortness of breath, or red spit (mucus).  You have pain in the face for more then 1-2 days, especially when you bend forward.  You have a fever, puffy  (swollen) neck, pain when you swallow, or white spots in the back of your throat.  You have a bad headache, ear pain, sinus pain, or chest pain.  You have a high-pitched whistling sound when you breathe in and out (wheezing).  You cough up blood.  You have sore muscles or a stiff neck. MAKE SURE YOU:   Understand these instructions.  Will watch your condition.  Will get help right away if you are not doing well or get worse. Document Released: 07/08/2007 Document Revised: 04/13/2011 Document Reviewed: 04/26/2013 Exit[employer] Patient Information 2015 [employer]. This information is not intended to replace advice given to you by your health care provider. Make sure you discuss any questions you have with your health care provider.  

## 2016-03-17 NOTE — Progress Notes (Signed)
HPI:  URI: -started: 4 days ago -symptoms:nasal congestion, sore throat, cough, swollen glands, L ear feels full -denies:fever, SOB, NVD, tooth pain, body aches -has tried: nothing -sick contacts/travel/risks: no reported flu, strep or tick exposure  ROS: See pertinent positives and negatives per HPI.  Past Medical History:  Diagnosis Date  . Anxiety   . GERD (gastroesophageal reflux disease)     Past Surgical History:  Procedure Laterality Date  . ABDOMINAL HYSTERECTOMY    . APPENDECTOMY    . BREAST DUCTAL SYSTEM EXCISION Right 01/02/2015   Procedure: RIGHT BREAST CENTRAL DUCT EXCISION;  Surgeon: Erroll Luna, MD;  Location: [place];  Service: General;  Laterality: Right;  . BUNIONECTOMY Bilateral   . CHOLECYSTECTOMY    . TONSILLECTOMY      Family History  Problem Relation Age of Onset  . Cancer Mother     Uterine and Breast  . Heart attack Mother   . Hyperlipidemia Mother   . Cancer Father     prostate cancer  . Heart attack Brother 73  . Hyperlipidemia Brother   . Hyperlipidemia Daughter     Social History   Social History  . Marital status: Divorced    Spouse name: N/A  . Number of children: 2  . Years of education: N/A   Occupational History  . 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 Smoking status: Former Smoker    Packs/day: 0.25    Years: 8.00    Quit date: 01/17/2016  . Smokeless tobacco: Never Used  . Alcohol use 4.8 oz/week    8 Standard drinks or equivalent per week     Comment: social  . Drug use: No  . Sexual activity: Not Asked   Other Topics Concern  . None   Social History Narrative  . None     Current Outpatient Prescriptions:  .  amoxicillin (AMOXIL) 500 MG capsule, Take 1 capsule (500 mg total) by mouth 3 (three) times daily., Disp: 30 capsule, Rfl: 0 .  omeprazole (PRILOSEC) 20 MG capsule, TAKE 1 CAPSULE (20 MG TOTAL) BY MOUTH DAILY., Disp: 90 capsule, Rfl: 1 .   venlafaxine XR (EFFEXOR-XR) 75 MG 24 hr capsule, Take 1 capsule (75 mg total) by mouth daily with breakfast., Disp: 30 capsule, Rfl: 3 .  atorvastatin (LIPITOR) 10 MG tablet, Take 1 tablet (10 mg total) by mouth daily., Disp: 30 tablet, Rfl: 0  EXAM:  Vitals:   03/17/16 1151  BP: 132/88  Pulse: 90  Temp: 98.2 F (36.8 C)    Body mass index is 28.78 kg/m.  GENERAL: vitals reviewed and listed above, alert, oriented, appears well hydrated and in no acute distress  HEENT: atraumatic, conjunttiva clear, no obvious abnormalities on inspection of external nose and ears, normal appearance of ear canals and TMs, clear nasal congestion, mild post oropharyngeal erythema with PND, no tonsillar edema or exudate - in fact she is s/p tonsillectomy, no sinus TTP  NECK: no obvious masses on inspection  LUNGS: clear to auscultation bilaterally, no wheezes, rales or rhonchi, good air movement  CV: HRRR, no peripheral edema  MS: moves all extremities without noticeable abnormality  PSYCH: pleasant and cooperative, no obvious depression or anxiety  ASSESSMENT AND PLAN:  Discussed the following assessment and plan:  Viral upper respiratory tract infection  -given HPI and exam findings today, a serious infection or illness is unlikely. We discussed potential etiologies, with VURI being most likely, and advised supportive care  and monitoring. We discussed treatment side effects, likely course, antibiotic misuse, transmission, and signs of developing a serious illness. -of course, we advised to return or notify a doctor immediately if symptoms worsen or persist or new concerns arise.    Patient Instructions  INSTRUCTIONS FOR UPPER RESPIRATORY INFECTION:  -plenty of rest and fluids  -nasal saline wash 2-3 times daily (use prepackaged nasal saline or bottled/distilled water if making your own)   -can use AFRIN nasal spray for drainage and nasal congestion - but do NOT use longer then 3-4  days  -can use tylenol (in no history of liver disease) or ibuprofen (if no history of kidney disease, bowel bleeding or significant heart disease) as directed for aches and sorethroat  -in the winter time, using a humidifier at night is helpful (please follow cleaning instructions)  -if you are taking a cough medication - use only as directed, may also try a teaspoon of honey to coat the throat and throat lozenges.   -for sore throat, salt water gargles can help  -follow up if you have fevers, facial pain, tooth pain, difficulty breathing or are worsening or symptoms persist longer then expected  Upper Respiratory Infection, Adult An upper respiratory infection (URI) is also known as the common cold. It is often caused by a type of germ (virus). Colds are easily spread (contagious). You can pass it to others by kissing, coughing, sneezing, or drinking out of the same glass. Usually, you get better in 1 to 3  weeks.  However, the cough can last for even longer. HOME CARE   Only take medicine as told by your doctor. Follow instructions provided above.  Drink enough water and fluids to keep your pee (urine) clear or pale yellow.  Get plenty of rest.  Return to work when your temperature is < 100 for 24 hours or as told by your doctor. You may use a face mask and wash your hands to stop your cold from spreading. GET HELP RIGHT AWAY IF:   After the first few days, you feel you are getting worse.  You have questions about your medicine.  You have chills, shortness of breath, or red spit (mucus).  You have pain in the face for more then 1-2 days, especially when you bend forward.  You have a fever, puffy (swollen) neck, pain when you swallow, or white spots in the back of your throat.  You have a bad headache, ear pain, sinus pain, or chest pain.  You have a high-pitched whistling sound when you breathe in and out (wheezing).  You cough up blood.  You have sore muscles or a stiff  neck. MAKE SURE YOU:   Understand these instructions.  Will watch your condition.  Will get help right away if you are not doing well or get worse. Document Released: 07/08/2007 Document Revised: 04/13/2011 Document Reviewed: 04/26/2013 [facility] Patient Information 2015 [place], [place]. This information is not intended to replace advice given to you by your health care provider. Make sure you discuss any questions you have with your health care provider.    Colin Benton R., DO

## 2016-03-17 NOTE — Progress Notes (Signed)
Pre visit review using our clinic review tool, if applicable. No additional management support is needed unless otherwise documented below in the visit note. 

## 2016-04-24 ENCOUNTER — Encounter: Payer: Self-pay | Admitting: Adult Health

## 2016-04-24 ENCOUNTER — Ambulatory Visit (INDEPENDENT_AMBULATORY_CARE_PROVIDER_SITE_OTHER): Payer: Commercial Managed Care - PPO | Admitting: Adult Health

## 2016-04-24 VITALS — BP 130/74 | Temp 98.2°F | Ht 61.5 in | Wt 154.0 lb

## 2016-04-24 DIAGNOSIS — J069 Acute upper respiratory infection, unspecified: Secondary | ICD-10-CM | POA: Diagnosis not present

## 2016-04-24 MED ORDER — AZITHROMYCIN 250 MG PO TABS: ORAL_TABLET | ORAL | 0 refills | Status: DC

## 2016-04-24 MED ORDER — PREDNISONE 10 MG PO TABS
ORAL_TABLET | ORAL | 0 refills | Status: DC
Start: 2016-04-24 — End: 2016-04-28

## 2016-04-24 MED ORDER — HYDROCODONE-HOMATROPINE 5-1.5 MG/5ML PO SYRP: 5.0000 mL | ORAL_SOLUTION | Freq: Three times a day (TID) | ORAL | 0 refills | Status: DC | PRN

## 2016-04-24 NOTE — Progress Notes (Signed)
Subjective:    Patient ID: Lynn Bautista, female    DOB: Oct 19, 1955, 61 y.o.   MRN: [id number]  HPI  61 year old female who  has a past medical history of Anxiety and GERD (gastroesophageal reflux disease). She presents to the office today for the acute complaint of " bronchitis" She reports that she has a history of bronchitis and this is what her symptoms normally are. Her complaint is that of dry hacking cough, sinus pain/pressure, and headaches. Her symptoms have been present for 4 days. Her symptoms have been getting worse   Denies any fever    Review of Systems Negative unless mentioned above  Past Medical History:  Diagnosis Date  . Anxiety   . GERD (gastroesophageal reflux disease)     Social History   Social History  . Marital status: Divorced    Spouse name: N/A  . Number of children: 2  . Years of education: N/A   Occupational History  . administrative assistant [employer]   Social History Main Topics  . Smoking status: Former Smoker    Packs/day: 0.25    Years: 8.00    Quit date: 01/17/2016  . Smokeless tobacco: Never Used  . Alcohol use 4.8 oz/week    8 Standard drinks or equivalent per week     Comment: social  . Drug use: No  . Sexual activity: Not on file   Other Topics Concern  . Not on file   Social History Narrative  . No narrative on file    Past Surgical History:  Procedure Laterality Date  . ABDOMINAL HYSTERECTOMY    . APPENDECTOMY    . BREAST DUCTAL SYSTEM EXCISION Right 01/02/2015   Procedure: RIGHT BREAST CENTRAL DUCT EXCISION;  Surgeon: Erroll Luna, MD;  Location: [place];  Service: General;  Laterality: Right;  . BUNIONECTOMY Bilateral   . CHOLECYSTECTOMY    . TONSILLECTOMY      Family History  Problem Relation Age of Onset  . Cancer Mother     Uterine and Breast  . Heart attack Mother   . Hyperlipidemia Mother   . Cancer Father     prostate cancer  . Heart attack Brother 26  . Hyperlipidemia  Brother   . Hyperlipidemia Daughter     No Known Allergies  Current Outpatient Prescriptions on File Prior to Visit  Medication Sig Dispense Refill  . omeprazole (PRILOSEC) 20 MG capsule TAKE 1 CAPSULE (20 MG TOTAL) BY MOUTH DAILY. 90 capsule 1  . venlafaxine XR (EFFEXOR-XR) 75 MG 24 hr capsule Take 1 capsule (75 mg total) by mouth daily with breakfast. 30 capsule 3  . atorvastatin (LIPITOR) 10 MG tablet Take 1 tablet (10 mg total) by mouth daily. 30 tablet 0   No current facility-administered medications on file prior to visit.     BP 130/74 (BP Location: Left Arm, Patient Position: Sitting, Cuff Size: Normal)   Temp 98.2 F (36.8 C) (Oral)   Ht 5' 1.5" (1.562 m)   Wt 154 lb (69.9 kg)   BMI 28.63 kg/m       Objective:   Physical Exam  Constitutional: She is oriented to person, place, and time. She appears well-developed and well-nourished. No distress.  HENT:  Head: Normocephalic and atraumatic.  Right Ear: External ear normal.  Left Ear: External ear normal.  Nose: Mucosal edema and rhinorrhea present. Right sinus exhibits frontal sinus tenderness. Right sinus exhibits no maxillary sinus tenderness. Left sinus exhibits frontal sinus  tenderness. Left sinus exhibits no maxillary sinus tenderness.  Mouth/Throat: Uvula is midline and oropharynx is clear and moist. No oropharyngeal exudate, posterior oropharyngeal edema or posterior oropharyngeal erythema.  s/p tonsillectomy    Eyes: Conjunctivae and EOM are normal. Pupils are equal, round, and reactive to light. Right eye exhibits no discharge. Left eye exhibits no discharge. No scleral icterus.  Neck: Normal range of motion. Neck supple.  Cardiovascular: Normal rate, regular rhythm, normal heart sounds and intact distal pulses.  Exam reveals no gallop.   No murmur heard. Pulmonary/Chest: Effort normal. No respiratory distress. She has wheezes (trace wheezes throughout ). She has no rales. She exhibits no tenderness.    Lymphadenopathy:    She has cervical adenopathy.  Neurological: She is alert and oriented to person, place, and time.  Skin: Skin is warm and dry. No rash noted. She is not diaphoretic. No erythema. No pallor.  Psychiatric: She has a normal mood and affect. Her behavior is normal. Judgment and thought content normal.  Nursing note and vitals reviewed.      Assessment & Plan:  1. Upper respiratory tract infection, unspecified type - predniSONE (DELTASONE) 10 MG tablet; 40 mg x 3 days, 20 mg x 3 days, 10 mg x 3 days  Dispense: 21 tablet; Refill: 0 - HYDROcodone-homatropine (HYCODAN) 5-1.5 MG/5ML syrup; Take 5 mLs by mouth every 8 (eight) hours as needed for cough.  Dispense: 120 mL; Refill: 0 - azithromycin (ZITHROMAX Z-PAK) 250 MG tablet; Take 2 tablets on Day 1.  Then take 1 tablet daily.  Dispense: 6 tablet; Refill: 0 - Follow up with PCP if needed  Dorothyann Peng, NP

## 2016-04-25 ENCOUNTER — Other Ambulatory Visit: Payer: Self-pay | Admitting: Family Medicine

## 2016-04-25 DIAGNOSIS — F419 Anxiety disorder, unspecified: Secondary | ICD-10-CM

## 2016-04-27 ENCOUNTER — Telehealth: Payer: Self-pay

## 2016-04-27 NOTE — Telephone Encounter (Signed)
Pt called to c/o increased dry cough and clear nasal drainage, possible fever with some sweats/chills. She reports that medications are not helping at all and that she feels worse and not better. She would like to know what else you would recommend as she has to go back to work.   Almyra Free - Please advise. Thanks!

## 2016-04-27 NOTE — Telephone Encounter (Signed)
She was seen by Tommi Rumps 3 days ago and provided prednisone, azithromycin, and medication for cough.   Unfortunately, cough can persist for 3 to 6 weeks.  As she has been treated with an antibiotic, symptoms are expected to improve.  If symptoms are worsening or new symptoms are developing, advise follow up for further evaluation. Also, advise continuing medication that was prescribed, prednisone will improve cough also.

## 2016-04-28 NOTE — Telephone Encounter (Signed)
Spoke with pt and gave recommendations. She states that she has not slept since starting the prednisone. Advised pt that prednisone can sometimes cause insomnia. She has stopped taking. She also reports cough is from pnd and not her chest. Advised pt to take Claritin in the AM and Zyrtec in PM in addition to saline nasal spray to help with pnd. Once clearing up continue Zyrtec at night. She denies any fever or colored mucus. She will call Thu if not improving. Prednisone added to allergy list as intolerance. Nothing further needed at this time.

## 2016-06-01 ENCOUNTER — Other Ambulatory Visit: Payer: Self-pay | Admitting: Surgery

## 2016-06-12 ENCOUNTER — Other Ambulatory Visit: Payer: Self-pay | Admitting: Family Medicine

## 2016-06-15 ENCOUNTER — Other Ambulatory Visit: Payer: Self-pay | Admitting: Family Medicine

## 2016-06-15 DIAGNOSIS — E785 Hyperlipidemia, unspecified: Secondary | ICD-10-CM

## 2016-06-16 ENCOUNTER — Other Ambulatory Visit: Payer: Self-pay

## 2016-06-16 MED ORDER — ATORVASTATIN CALCIUM 10 MG PO TABS: 10.0000 mg | ORAL_TABLET | Freq: Every day | ORAL | 1 refills | Status: DC

## 2016-06-22 ENCOUNTER — Other Ambulatory Visit: Payer: Self-pay | Admitting: Family Medicine

## 2016-08-29 ENCOUNTER — Other Ambulatory Visit: Payer: Self-pay | Admitting: Family Medicine

## 2016-08-29 DIAGNOSIS — F419 Anxiety disorder, unspecified: Secondary | ICD-10-CM

## 2016-10-12 ENCOUNTER — Other Ambulatory Visit: Payer: Self-pay | Admitting: Family Medicine

## 2016-10-22 ENCOUNTER — Encounter: Payer: Self-pay | Admitting: Family Medicine

## 2017-03-30 ENCOUNTER — Other Ambulatory Visit: Payer: Self-pay | Admitting: Family Medicine

## 2017-03-30 DIAGNOSIS — F419 Anxiety disorder, unspecified: Secondary | ICD-10-CM

## 2017-05-12 ENCOUNTER — Encounter: Payer: Self-pay | Admitting: Family Medicine

## 2017-05-12 ENCOUNTER — Ambulatory Visit: Payer: Commercial Managed Care - PPO | Admitting: Family Medicine

## 2017-05-12 VITALS — BP 98/66 | HR 75 | Temp 98.4°F | Ht 61.5 in | Wt 158.0 lb

## 2017-05-12 DIAGNOSIS — L02821 Furuncle of head [any part, except face]: Secondary | ICD-10-CM | POA: Diagnosis not present

## 2017-05-12 MED ORDER — DOXYCYCLINE HYCLATE 100 MG PO CAPS: 100.0000 mg | ORAL_CAPSULE | Freq: Two times a day (BID) | ORAL | 0 refills | Status: AC

## 2017-05-12 NOTE — Progress Notes (Signed)
   Subjective:    Patient ID: Lynn Bautista, female    DOB: September 09, 1955, 62 y.o.   MRN: [id number]  HPI Here for 4 days of a tender lump on the scalp. This morning she noticed swollen nodes on the posterior neck that were tender. No fever.    Review of Systems  Constitutional: Negative.   Respiratory: Negative.   Cardiovascular: Negative.   Hematological: Positive for adenopathy.       Objective:   Physical Exam  Constitutional: She appears well-developed and well-nourished.  Cardiovascular: Normal rate, regular rhythm, normal heart sounds and intact distal pulses.  Pulmonary/Chest: Effort normal and breath sounds normal. No respiratory distress. She has no wheezes. She has no rales.  Skin:  The posterior scalp has a small boil. Several occipital lymph nodes are enlarged and tender           Assessment & Plan:  Boil. This was opened with a sterile needle and some purulent material was expressed. Cover with Doxycycline.  Alysia Penna, MD

## 2017-07-15 ENCOUNTER — Other Ambulatory Visit: Payer: Self-pay | Admitting: Family Medicine

## 2017-07-15 DIAGNOSIS — F419 Anxiety disorder, unspecified: Secondary | ICD-10-CM

## 2017-07-15 NOTE — Telephone Encounter (Signed)
Copied from [place] [id number]. Topic: Quick Communication - Rx Refill/Question >> Jul 15, 2017  3:30 PM Boyd Kerbs wrote: Medication:  atorvastatin (LIPITOR) 10 MG tablet venlafaxine XR (EFFEXOR-XR) 75 MG 24 hr capsule  She only has a few left  She has appt. 6/24 but will not have enough  Has the patient contacted their pharmacy? Yes.   (Agent: If no, request that the patient contact the pharmacy for the refill.) (Agent: If yes, when and what did the pharmacy advise?)  Preferred Pharmacy (with phone number or street name):   [id number] - [place], [address] [place] [address] Phone: [phone] Fax: [phone]    Agent: Please be advised that RX refills may take up to 3 business days. We ask that you follow-up with your pharmacy.

## 2017-07-16 MED ORDER — ATORVASTATIN CALCIUM 10 MG PO TABS: 10.0000 mg | ORAL_TABLET | Freq: Every day | ORAL | 0 refills | Status: DC

## 2017-07-16 MED ORDER — VENLAFAXINE HCL ER 75 MG PO CP24: 75.0000 mg | ORAL_CAPSULE | Freq: Every day | ORAL | 0 refills | Status: DC

## 2017-07-26 ENCOUNTER — Encounter: Payer: Self-pay | Admitting: Family Medicine

## 2017-07-26 ENCOUNTER — Ambulatory Visit: Payer: Commercial Managed Care - PPO | Admitting: Family Medicine

## 2017-07-26 VITALS — BP 120/84 | HR 74 | Temp 98.6°F | Ht 61.0 in | Wt 156.0 lb

## 2017-07-26 DIAGNOSIS — F419 Anxiety disorder, unspecified: Secondary | ICD-10-CM

## 2017-07-26 DIAGNOSIS — F17210 Nicotine dependence, cigarettes, uncomplicated: Secondary | ICD-10-CM | POA: Diagnosis not present

## 2017-07-26 MED ORDER — VENLAFAXINE HCL ER 75 MG PO CP24: 75.0000 mg | ORAL_CAPSULE | Freq: Every day | ORAL | 1 refills | Status: DC
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
## 2017-07-26 NOTE — Patient Instructions (Signed)
Panic Attack A panic attack is a sudden episode of severe anxiety, fear, or discomfort that causes physical and emotional symptoms. The attack may be in response to something frightening, or it may occur for no known reason. Symptoms of a panic attack can be similar to symptoms of a heart attack or stroke. It is important to see your health care provider when you have a panic attack so that these conditions can be ruled out. A panic attack is a symptom of another condition. Most panic attacks go away with treatment of the underlying problem. If you have panic attacks often, you may have a condition called panic disorder. What are the causes? A panic attack may be caused by:  An extreme, life-threatening situation, such as a war or natural disaster.  An anxiety disorder, such as post-traumatic stress disorder.  Depression.  Certain medical conditions, including heart problems, neurological conditions, and infections.  Certain over-the-counter and prescription medicines.  Illegal drugs that increase heart rate and blood pressure, such as methamphetamine.  Alcohol.  Supplements that increase anxiety.  Panic disorder.  What increases the risk? You are more likely to develop this condition if:  You have an anxiety disorder.  You have another mental health condition.  You take certain medicines.  You use alcohol, illegal drugs, or other substances.  You are under extreme stress.  A life event is causing increased feelings of anxiety and depression.  What are the signs or symptoms? A panic attack starts suddenly, usually lasts about 20 minutes, and occurs with one or more of the following:  A pounding heart.  A feeling that your heart is beating irregularly or faster than normal (palpitations).  Sweating.  Trembling or shaking.  Shortness of breath or feeling smothered.  Feeling choked.  Chest pain or discomfort.  Nausea or a strange feeling in your  stomach.  Dizziness, feeling lightheaded, or feeling like you might faint.  Chills or hot flashes.  Numbness or tingling in your lips, hands, or feet.  Feeling confused, or feeling that you are not yourself.  Fear of losing control or being emotionally unstable.  Fear of dying.  How is this diagnosed? A panic attack is diagnosed with an assessment by your health care provider. During the assessment your health care provider will ask questions about:  Your history of anxiety, depression, and panic attacks.  Your medical history.  Whether you drink alcohol, use illegal drugs, take supplements, or take medicines. Be honest about your substance use.  Your health care provider may also:  Order blood tests or other kinds of tests to rule out serious medical conditions.  Refer you to a mental health professional for further evaluation.  How is this treated? Treatment depends on the cause of the panic attack:  If the cause is a medical problem, your health care provider will either treat that problem or refer you to a specialist.  If the cause is emotional, you may be given anti-anxiety medicines or referred to a counselor. These medicines may reduce how often attacks happen, reduce how severe the attacks are, and lower anxiety.  If the cause is a medicine, your health care provider may tell you to stop the medicine, change your dose, or take a different medicine.  If the cause is a drug, treatment may involve letting the drug wear off and taking medicine to help the drug leave your body or to counteract its effects. Attacks caused by drug abuse may continue even if you stop using   the drug.  Follow these instructions at home:  Take over-the-counter and prescription medicines only as told by your health care provider.  If you feel anxious, limit your caffeine intake.  Take good care of your physical and mental health by: ? Eating a balanced diet that includes plenty of fresh  fruits and vegetables, whole grains, lean meats, and low-fat dairy. ? Getting plenty of rest. Try to get 7-8 hours of uninterrupted sleep each night. ? Exercising regularly. Try to get 30 minutes of physical activity at least 5 days a week. ? Not smoking. Talk to your health care provider if you need help quitting. ? Limiting alcohol intake to no more than 1 drink a day for nonpregnant women and 2 drinks a day for men. One drink equals 12 oz of beer, 5 oz of wine, or 1 oz of hard liquor.  Keep all follow-up visits as told by your health care provider. This is important. Panic attacks may have underlying physical or emotional problems that take time to accurately diagnose. Contact a health care provider if:  Your symptoms do not improve, or they get worse.  You are not able to take your medicine as prescribed because of side effects. Get help right away if:  You have serious thoughts about hurting yourself or others.  You have symptoms of a panic attack. Do not drive yourself to the hospital. Have someone else drive you or call an ambulance. If you ever feel like you may hurt yourself or others, or you have thoughts about taking your own life, get help right away. You can go to your nearest emergency department or call:  Your local emergency services (911 in the [place]  A suicide crisis helpline, such as the [place] at 703 855 1396. This is open 24 hours a day.  Summary  A panic attack is a sign of a serious health or mental health condition. Get help right away. Do not drive yourself to the hospital. Have someone else drive you or call an ambulance.  Always see a health care provider to have the reasons for the panic attack correctly diagnosed.  If your panic attack was caused by a physical problem, follow your health care provider's suggestions for medicine, referral to a specialist, and lifestyle changes.  If your panic attack was caused by an  emotional problem, follow through with counseling from a qualified mental health specialist.  If you feel like you may hurt yourself or others, call 911 and get help right away. This information is not intended to replace advice given to you by your health care provider. Make sure you discuss any questions you have with your health care provider. Document Released: 01/19/2005 Document Revised: 02/28/2016 Document Reviewed: 02/28/2016 Elsevier Interactive Patient Education  2018 [place] with Quitting Smoking Quitting smoking is a physical and mental challenge. You will face cravings, withdrawal symptoms, and temptation. Before quitting, work with your health care provider to make a plan that can help you cope. Preparation can help you quit and keep you from giving in. How can I cope with cravings? Cravings usually last for 5-10 minutes. If you get through it, the craving will pass. Consider taking the following actions to help you cope with cravings:  Keep your mouth busy: ? Chew sugar-free gum. ? Suck on hard candies or a straw. ? Brush your teeth.  Keep your hands and body busy: ? Immediately change to a different activity when you feel a craving. ?  Squeeze or play with a ball. ? Do an activity or a hobby, like making bead jewelry, practicing needlepoint, or working with wood. ? Mix up your normal routine. ? Take a short exercise break. Go for a quick walk or run up and down stairs. ? Spend time in public places where smoking is not allowed.  Focus on doing something kind or helpful for someone else.  Call a friend or family member to talk during a craving.  Join a support group.  Call a quit line, such as 1-800-QUIT-NOW.  Talk with your health care provider about medicines that might help you cope with cravings and make quitting easier for you.  How can I deal with withdrawal symptoms? Your body may experience negative effects as it tries to get used to not having  nicotine in the system. These effects are called withdrawal symptoms. They may include:  Feeling hungrier than normal.  Trouble concentrating.  Irritability.  Trouble sleeping.  Feeling depressed.  Restlessness and agitation.  Craving a cigarette.  To manage withdrawal symptoms:  Avoid places, people, and activities that trigger your cravings.  Remember why you want to quit.  Get plenty of sleep.  Avoid coffee and other caffeinated drinks. These may worsen some of your symptoms.  How can I handle social situations? Social situations can be difficult when you are quitting smoking, especially in the first few weeks. To manage this, you can:  Avoid parties, bars, and other social situations where people might be smoking.  Avoid alcohol.  Leave right away if you have the urge to smoke.  Explain to your family and friends that you are quitting smoking. Ask for understanding and support.  Plan activities with friends or family where smoking is not an option.  What are some ways I can cope with stress? Wanting to smoke may cause stress, and stress can make you want to smoke. Find ways to manage your stress. Relaxation techniques can help. For example:  Breathe slowly and deeply, in through your nose and out through your mouth.  Listen to soothing, relaxing music.  Talk with a family member or friend about your stress.  Light a candle.  Soak in a bath or take a shower.  Think about a peaceful place.  What are some ways I can prevent weight gain? Be aware that many people gain weight after they quit smoking. However, not everyone does. To keep from gaining weight, have a plan in place before you quit and stick to the plan after you quit. Your plan should include:  Having healthy snacks. When you have a craving, it may help to: ? Eat plain popcorn, crunchy carrots, celery, or other cut vegetables. ? Chew sugar-free gum.  Changing how you eat: ? Eat small portion  sizes at meals. ? Eat 4-6 small meals throughout the day instead of 1-2 large meals a day. ? Be mindful when you eat. Do not watch television or do other things that might distract you as you eat.  Exercising regularly: ? Make time to exercise each day. If you do not have time for a long workout, do short bouts of exercise for 5-10 minutes several times a day. ? Do some form of strengthening exercise, like weight lifting, and some form of aerobic exercise, like running or swimming.  Drinking plenty of water or other low-calorie or no-calorie drinks. Drink 6-8 glasses of water daily, or as much as instructed by your health care provider.  Summary  Quitting smoking is  a physical and mental challenge. You will face cravings, withdrawal symptoms, and temptation to smoke again. Preparation can help you as you go through these challenges.  You can cope with cravings by keeping your mouth busy (such as by chewing gum), keeping your body and hands busy, and making calls to family, friends, or a helpline for people who want to quit smoking.  You can cope with withdrawal symptoms by avoiding places where people smoke, avoiding drinks with caffeine, and getting plenty of rest.  Ask your health care provider about the different ways to prevent weight gain, avoid stress, and handle social situations. This information is not intended to replace advice given to you by your health care provider. Make sure you discuss any questions you have with your health care provider. Document Released: 01/17/2016 Document Revised: 01/17/2016 Document Reviewed: 01/17/2016 Elsevier Interactive Patient Education  2018 [employer]. Varenicline oral tablets What is this medicine? VARENICLINE (var EN i kleen) is used to help people quit smoking. It can reduce the symptoms caused by stopping smoking. It is used with a patient support program recommended by your physician. This medicine may be used for other purposes; ask  your health care provider or pharmacist if you have questions. COMMON BRAND NAME(S): Chantix What should I tell my health care provider before I take this medicine? They need to know if you have any of these conditions: -bipolar disorder, depression, schizophrenia or other mental illness -heart disease -if you often drink alcohol -kidney disease -peripheral vascular disease -seizures -stroke -suicidal thoughts, plans, or attempt; a previous suicide attempt by you or a family member -an unusual or allergic reaction to varenicline, other medicines, foods, dyes, or preservatives -pregnant or trying to get pregnant -breast-feeding How should I use this medicine? Take this medicine by mouth after eating. Take with a full glass of water. Follow the directions on the prescription label. Take your doses at regular intervals. Do not take your medicine more often than directed. There are 3 ways you can use this medicine to help you quit smoking; talk to your health care professional to decide which plan is right for you: 1) you can choose a quit date and start this medicine 1 week before the quit date, or, 2) you can start taking this medicine before you choose a quit date, and then pick a quit date between day 8 and 35 days of treatment, or, 3) if you are not sure that you are able or willing to quit smoking right away, start taking this medicine and slowly decrease the amount you smoke as directed by your health care professional with the goal of being cigarette-free by week 12 of treatment. Stick to your plan; ask about support groups or other ways to help you remain cigarette-free. If you are motivated to quit smoking and did not succeed during a previous attempt with this medicine for reasons other than side effects, or if you returned to smoking after this treatment, speak with your health care professional about whether another course of this medicine may be right for you. A special MedGuide will  be given to you by the pharmacist with each prescription and refill. Be sure to read this information carefully each time. Talk to your pediatrician regarding the use of this medicine in children. This medicine is not approved for use in children. Overdosage: If you think you have taken too much of this medicine contact a poison control center or emergency room at once. NOTE: This medicine is only  for you. Do not share this medicine with others. What if I miss a dose? If you miss a dose, take it as soon as you can. If it is almost time for your next dose, take only that dose. Do not take double or extra doses. What may interact with this medicine? -alcohol or any product that contains alcohol -insulin -other stop smoking aids -theophylline -warfarin This list may not describe all possible interactions. Give your health care provider a list of all the medicines, herbs, non-prescription drugs, or dietary supplements you use. Also tell them if you smoke, drink alcohol, or use illegal drugs. Some items may interact with your medicine. What should I watch for while using this medicine? Visit your doctor or health care professional for regular check ups. Ask for ongoing advice and encouragement from your doctor or healthcare professional, friends, and family to help you quit. If you smoke while on this medication, quit again Your mouth may get dry. Chewing sugarless gum or sucking hard candy, and drinking plenty of water may help. Contact your doctor if the problem does not go away or is severe. You may get drowsy or dizzy. Do not drive, use machinery, or do anything that needs mental alertness until you know how this medicine affects you. Do not stand or sit up quickly, especially if you are an older patient. This reduces the risk of dizzy or fainting spells. Sleepwalking can happen during treatment with this medicine, and can sometimes lead to behavior that is harmful to you, other people, or property.  Stop taking this medicine and tell your doctor if you start sleepwalking or have other unusual sleep-related activity. Decrease the amount of alcoholic beverages that you drink during treatment with this medicine until you know if this medicine affects your ability to tolerate alcohol. Some people have experienced increased drunkenness (intoxication), unusual or sometimes aggressive behavior, or no memory of things that have happened (amnesia) during treatment with this medicine. The use of this medicine may increase the chance of suicidal thoughts or actions. Pay special attention to how you are responding while on this medicine. Any worsening of mood, or thoughts of suicide or dying should be reported to your health care professional right away. What side effects may I notice from receiving this medicine? Side effects that you should report to your doctor or health care professional as soon as possible: -allergic reactions like skin rash, itching or hives, swelling of the face, lips, tongue, or throat -acting aggressive, being angry or violent, or acting on dangerous impulses -breathing problems -changes in vision -chest pain or chest tightness -confusion, trouble speaking or understanding -new or worsening depression, anxiety, or panic attacks -extreme increase in activity and talking (mania) -fast, irregular heartbeat -feeling faint or lightheaded, falls -fever -pain in legs when walking -problems with balance, talking, walking -redness, blistering, peeling or loosening of the skin, including inside the mouth -ringing in ears -seeing or hearing things that aren't there (hallucinations) -seizures -sleepwalking -sudden numbness or weakness of the face, arm or leg -thoughts about suicide or dying, or attempts to commit suicide -trouble passing urine or change in the amount of urine -unusual bleeding or bruising -unusually weak or tired Side effects that usually do not require medical  attention (report to your doctor or health care professional if they continue or are bothersome): -constipation -headache -nausea, vomiting -strange dreams -stomach gas -trouble sleeping This list may not describe all possible side effects. Call your doctor for medical advice about side effects.  You may report side effects to FDA at 1-800-FDA-1088. Where should I keep my medicine? Keep out of the reach of children. Store at room temperature between 15 and 30 degrees C (59 and 86 degrees F). Throw away any unused medicine after the expiration date. NOTE: This sheet is a summary. It may not cover all possible information. If you have questions about this medicine, talk to your doctor, pharmacist, or health care provider.  2018 Elsevier/Gold Standard (2014-10-04 16:14:23) Nicotine skin patches What is this medicine? NICOTINE ([place]) helps people stop smoking. The patches replace the nicotine found in cigarettes and help to decrease withdrawal effects. They are most effective when used in combination with a stop-smoking program. This medicine may be used for other purposes; ask your health care provider or pharmacist if you have questions. COMMON BRAND NAME(S): Habitrol, Nicoderm CQ, Nicotrol What should I tell my health care provider before I take this medicine? They need to know if you have any of these conditions: -diabetes -heart disease, angina, irregular heartbeat or previous heart attack -high blood pressure -lung disease, including asthma -overactive thyroid -pheochromocytoma -seizures or a history of seizures -skin problems, like eczema -stomach problems or ulcers -an unusual or allergic reaction to nicotine, adhesives, other medicines, foods, dyes, or preservatives -pregnant or trying to get pregnant -breast-feeding How should I use this medicine? This medicine is for use on the skin. Follow the directions that come with the patches. Find an area of skin on your upper  arm, chest, or back that is clean, dry, greaseless, undamaged and hairless. Wash hands with plain soap and water. Do not use anything that contains aloe, lanolin or glycerin as these may prevent the patch from sticking. Dry thoroughly. Remove the patch from the sealed pouch. Do not try to cut or trim the patch. Using your palm, press the patch firmly in place for 10 seconds to make sure that there is good contact with your skin. After applying the patch, wash your hands. Change the patch every day, keeping to a regular schedule. When you apply a new patch, use a new area of skin. Wait at least 1 week before using the same area again. Talk to your pediatrician regarding the use of this medicine in children. Special care may be needed. Overdosage: If you think you have taken too much of this medicine contact a poison control center or emergency room at once. NOTE: This medicine is only for you. Do not share this medicine with others. What if I miss a dose? If you forget to replace a patch, use it as soon as you can. Only use one patch at a time and do not leave on the skin for longer than directed. If a patch falls off, you can replace it, but keep to your schedule and remove the patch at the right time. What may interact with this medicine? -medicines for asthma -medicines for blood pressure -medicines for mental depression This list may not describe all possible interactions. Give your health care provider a list of all the medicines, herbs, non-prescription drugs, or dietary supplements you use. Also tell them if you smoke, drink alcohol, or use illegal drugs. Some items may interact with your medicine. What should I watch for while using this medicine? You should begin using the nicotine patch the day you stop smoking. It is okay if you do not succeed at your attempt to quit and have a cigarette. You can still continue your quit attempt and keep using  the product as directed. Just throw away your  cigarettes and get back to your quit plan. You can keep the patch in place during swimming, bathing, and showering. If your patch falls off during these activities, replace it. When you first apply the patch, your skin may itch or burn. This should go away soon. When you remove a patch, the skin may look red, but this should only last for a few days. Call your doctor or health care professional if skin redness does not go away after 4 days, if your skin swells, or if you get a rash. If you are a diabetic and you quit smoking, the effects of insulin may be increased and you may need to reduce your insulin dose. Check with your doctor or health care professional about how you should adjust your insulin dose. If you are going to have a magnetic resonance imaging (MRI) procedure, tell your MRI technician if you have this patch on your body. It must be removed before a MRI. What side effects may I notice from receiving this medicine? Side effects that you should report to your doctor or health care professional as soon as possible: -allergic reactions like skin rash, itching or hives, swelling of the face, lips, or tongue -breathing problems -changes in hearing -changes in vision -chest pain -cold sweats -confusion -fast, irregular heartbeat -feeling faint or lightheaded, falls -headache -increased saliva -skin redness that lasts more than 4 days -stomach pain -signs and symptoms of nicotine overdose like nausea; vomiting; dizziness; weakness; and rapid heartbeat Side effects that usually do not require medical attention (report to your doctor or health care professional if they continue or are bothersome): -diarrhea -dry mouth -hiccups -irritability -nervousness or restlessness -trouble sleeping or vivid dreams This list may not describe all possible side effects. Call your doctor for medical advice about side effects. You may report side effects to FDA at 1-800-FDA-1088. Where should I keep  my medicine? Keep out of the reach of children. Store at room temperature between 20 and 25 degrees C (68 and 77 degrees F). Protect from heat and light. Store in International aid/development worker until ready to use. Throw away unused medicine after the expiration date. When you remove a patch, fold with sticky sides together; put in an empty opened pouch and throw away. NOTE: This sheet is a summary. It may not cover all possible information. If you have questions about this medicine, talk to your doctor, pharmacist, or health care provider.  2018 Elsevier/Gold Standard (2013-12-18 15:46:21)

## 2017-08-13 DIAGNOSIS — M1711 Unilateral primary osteoarthritis, right knee: Secondary | ICD-10-CM | POA: Insufficient documentation

## 2017-09-07 ENCOUNTER — Telehealth: Payer: Self-pay | Admitting: Family Medicine

## 2017-09-07 NOTE — Telephone Encounter (Signed)
Dr. Volanda Napoleon patient. Message sent to [place].

## 2017-09-07 NOTE — Telephone Encounter (Signed)
Copied from [place] [id number]. Topic: Quick Communication - See Telephone Encounter >> Sep 07, 2017  1:11 PM Vernona Rieger wrote: CRM for notification. See Telephone encounter for: 09/07/17.  Patient was in on 6/24 and all meds were suppose to be sent to her pharmacy. Patient said the only one that was sent was her venlafaxine XR (EFFEXOR-XR) 75 MG 24 hr capsule but not her atorvastatin (LIPITOR) 10 MG tablet & her omeprazole (PRILOSEC) 20 MG capsule. Please advise.  [id number] [address], [place] [address] [address]

## 2017-09-08 ENCOUNTER — Other Ambulatory Visit: Payer: Self-pay

## 2017-09-08 MED ORDER — OMEPRAZOLE 20 MG PO CPDR: DELAYED_RELEASE_CAPSULE | ORAL | 1 refills | Status: DC

## 2017-09-08 MED ORDER — ATORVASTATIN CALCIUM 10 MG PO TABS: 10.0000 mg | ORAL_TABLET | Freq: Every day | ORAL | 0 refills | Status: DC

## 2017-09-08 NOTE — Telephone Encounter (Signed)
Rx refills for Omeprazole and Lipitor sent to pharmacy per pt request.

## 2017-09-14 ENCOUNTER — Other Ambulatory Visit: Payer: Self-pay | Admitting: Family Medicine

## 2017-09-14 DIAGNOSIS — M8580 Other specified disorders of bone density and structure, unspecified site: Secondary | ICD-10-CM

## 2017-09-14 DIAGNOSIS — Z1231 Encounter for screening mammogram for malignant neoplasm of breast: Secondary | ICD-10-CM

## 2017-09-23 ENCOUNTER — Telehealth: Payer: Self-pay

## 2017-09-23 NOTE — Telephone Encounter (Signed)
Copied from [place] [id number]. Topic: Quick Communication - See Telephone Encounter >> Sep 23, 2017  3:30 PM Lynn Bautista wrote: CRM for notification. See Telephone encounter for: 09/23/17.  Pt would like to transition care to provider Martinique from [place].   Is this switch okay?

## 2017-09-23 NOTE — Telephone Encounter (Signed)
Dr. Volanda Napoleon, Is patient ok to transfer?

## 2017-09-27 NOTE — Telephone Encounter (Signed)
Pt requests to transfer care to Dr Martinique, please advise if ok

## 2017-09-29 NOTE — Telephone Encounter (Signed)
ok 

## 2017-10-05 NOTE — Telephone Encounter (Signed)
Pt has been scheduled for a transfer of care on 11/02/2017 at 8 am with Dr Martinique

## 2017-10-19 ENCOUNTER — Telehealth: Payer: Self-pay

## 2017-10-19 NOTE — Telephone Encounter (Signed)
Spoke with Faby with [facility] regarding the order for Bone density on pt, explained that the order placed on Epic was placed by a different provider and the faxed order needs to be signed by the same provider. Faby state that she will contact the pt and have her reschedule until pt establishes care with Dr Martinique then get a Bone density order signed by dr Martinique.

## 2017-10-19 NOTE — Telephone Encounter (Signed)
Copied from [place] [id number]. Topic: General - Other >> Oct 19, 2017  1:57 PM Lennox Solders wrote: Reason for CRM: faby from [facility] is calling. Pt is schedule for bone density test tomorrow at 730 am.  Gentry Fitz will fax an order to be sign for bone density test if order is not received  by 4 pm they will cancel the patient appt.

## 2017-10-20 ENCOUNTER — Other Ambulatory Visit: Payer: Commercial Managed Care - PPO

## 2017-10-20 ENCOUNTER — Ambulatory Visit
Admission: RE | Admit: 2017-10-20 | Discharge: 2017-10-20 | Disposition: A | Discharge status: Home / Self Care | Payer: Commercial Managed Care - PPO | Source: Ambulatory Visit | Attending: Family Medicine | Admitting: Family Medicine

## 2017-10-20 DIAGNOSIS — Z1231 Encounter for screening mammogram for malignant neoplasm of breast: Secondary | ICD-10-CM

## 2017-10-21 ENCOUNTER — Other Ambulatory Visit: Payer: Self-pay | Admitting: Family Medicine

## 2017-10-21 DIAGNOSIS — R928 Other abnormal and inconclusive findings on diagnostic imaging of breast: Secondary | ICD-10-CM

## 2017-10-22 ENCOUNTER — Other Ambulatory Visit: Payer: Self-pay | Admitting: Family Medicine

## 2017-10-22 ENCOUNTER — Telehealth: Payer: Self-pay

## 2017-10-22 DIAGNOSIS — R928 Other abnormal and inconclusive findings on diagnostic imaging of breast: Secondary | ICD-10-CM

## 2017-10-22 NOTE — Telephone Encounter (Signed)
Lynn Bautista called back from [facility] and said she received the signed order from Dr Volanda Napoleon for the bone density. She said that now they need a signed order from Dr Volanda Napoleon for her Mammogram she is having on Monday at 8:10 am. She said you can fax that or put into epic. Fax number [phone]

## 2017-10-25 ENCOUNTER — Ambulatory Visit
Admission: RE | Admit: 2017-10-25 | Discharge: 2017-10-25 | Disposition: A | Discharge status: Home / Self Care | Payer: Commercial Managed Care - PPO | Source: Ambulatory Visit | Attending: Family Medicine | Admitting: Family Medicine

## 2017-10-25 DIAGNOSIS — R928 Other abnormal and inconclusive findings on diagnostic imaging of breast: Secondary | ICD-10-CM

## 2017-10-25 NOTE — Telephone Encounter (Signed)
Called Faby left a message to call our office regarding pt Mammogram order, the order is on Epic placed by Dr Volanda Napoleon his morning.

## 2017-10-31 ENCOUNTER — Other Ambulatory Visit: Payer: Self-pay | Admitting: Family Medicine

## 2017-11-01 ENCOUNTER — Encounter: Payer: Commercial Managed Care - PPO | Admitting: Family Medicine

## 2017-11-01 ENCOUNTER — Other Ambulatory Visit: Payer: Self-pay

## 2017-11-01 MED ORDER — ATORVASTATIN CALCIUM 10 MG PO TABS: 10.0000 mg | ORAL_TABLET | Freq: Every day | ORAL | 0 refills | Status: DC

## 2017-11-01 NOTE — Telephone Encounter (Signed)
Pt rx sent to the pharmacy for refills

## 2017-11-02 ENCOUNTER — Encounter: Payer: Self-pay | Admitting: Family Medicine

## 2017-11-02 ENCOUNTER — Ambulatory Visit: Payer: Commercial Managed Care - PPO | Admitting: Family Medicine

## 2017-11-02 VITALS — BP 98/68 | HR 73 | Temp 98.3°F | Resp 12 | Ht 61.0 in | Wt 160.4 lb

## 2017-11-02 DIAGNOSIS — K219 Gastro-esophageal reflux disease without esophagitis: Secondary | ICD-10-CM | POA: Diagnosis not present

## 2017-11-02 DIAGNOSIS — F419 Anxiety disorder, unspecified: Secondary | ICD-10-CM

## 2017-11-02 DIAGNOSIS — K760 Fatty (change of) liver, not elsewhere classified: Secondary | ICD-10-CM | POA: Diagnosis not present

## 2017-11-02 DIAGNOSIS — E785 Hyperlipidemia, unspecified: Secondary | ICD-10-CM | POA: Diagnosis not present

## 2017-11-02 LAB — COMPREHENSIVE METABOLIC PANEL
ALT: 25 U/L (ref 0–35)
AST: 18 U/L (ref 0–37)
Albumin: 4.2 g/dL (ref 3.5–5.2)
Alkaline Phosphatase: 79 U/L (ref 39–117)
BUN: 16 mg/dL (ref 6–23)
CHLORIDE: 105 meq/L (ref 96–112)
CO2: 29 mEq/L (ref 19–32)
Calcium: 9.3 mg/dL (ref 8.4–10.5)
Creatinine, Ser: 0.79 mg/dL (ref 0.40–1.20)
GFR: 77.75 mL/min (ref >60.00)
GLUCOSE: 104 mg/dL — AB (ref 70–99)
POTASSIUM: 4.3 meq/L (ref 3.5–5.1)
Sodium: 139 mEq/L (ref 135–145)
Total Bilirubin: 0.5 mg/dL (ref 0.2–1.2)
Total Protein: 6.7 g/dL (ref 6.0–8.3)

## 2017-11-02 LAB — LIPID PANEL
CHOL/HDL RATIO: 3
Cholesterol: 187 mg/dL (ref 0–200)
HDL: 54.3 mg/dL (ref >39.00)
LDL Cholesterol: 111 mg/dL — ABNORMAL HIGH (ref 0–99)
NONHDL: 132.97
Triglycerides: 110 mg/dL (ref 0.0–149.0)
VLDL: 22 mg/dL (ref 0.0–40.0)

## 2017-11-02 MED ORDER — ZOSTER VAC RECOMB ADJUVANTED 50 MCG/0.5ML IM SUSR: INTRAMUSCULAR | 1 refills | Status: DC

## 2017-11-02 NOTE — Assessment & Plan Note (Signed)
No changes in current management,Lipitor 10 mg dily. We will follow labs done today and will give further recommendations accordingly. Continue low fat diet. F/U in 6-12 months.

## 2017-11-02 NOTE — Assessment & Plan Note (Signed)
Well controlled. No changes in Prilosec 20 mg daily prn. GERD precautions also discussed. F/U annually,before if needed.

## 2017-11-02 NOTE — Assessment & Plan Note (Signed)
Stable. She reports symptoms as well controlled. No changes in Effexor XR 75 mg daily. F/U annually.

## 2017-11-02 NOTE — Patient Instructions (Signed)
A few things to remember from today's visit:   Hyperlipidemia, unspecified hyperlipidemia type - Plan: Comprehensive metabolic panel, Lipid panel  Gastroesophageal reflux disease without esophagitis  Anxiety   Please be sure medication list is accurate. If a new problem present, please set up appointment sooner than planned today.

## 2017-11-02 NOTE — Assessment & Plan Note (Signed)
Reported by pt,seen in abdominal CT 3-4 years ago. Recommend decreasing beer intake. We discussed alcohol intake recommendations for women.
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
## 2017-11-06 ENCOUNTER — Encounter: Payer: Self-pay | Admitting: Family Medicine

## 2017-11-06 MED ORDER — ATORVASTATIN CALCIUM 10 MG PO TABS: 10.0000 mg | ORAL_TABLET | Freq: Every day | ORAL | 3 refills | Status: DC

## 2018-01-11 ENCOUNTER — Other Ambulatory Visit: Payer: Self-pay | Admitting: Family Medicine

## 2018-01-11 DIAGNOSIS — F419 Anxiety disorder, unspecified: Secondary | ICD-10-CM

## 2018-01-11 NOTE — Telephone Encounter (Signed)
This is Dr. Jordan pt. °

## 2018-02-15 ENCOUNTER — Ambulatory Visit: Payer: Commercial Managed Care - PPO | Admitting: Gastroenterology

## 2018-02-16 ENCOUNTER — Ambulatory Visit: Payer: Commercial Managed Care - PPO | Admitting: Gastroenterology

## 2018-02-21 ENCOUNTER — Ambulatory Visit (INDEPENDENT_AMBULATORY_CARE_PROVIDER_SITE_OTHER): Payer: Commercial Managed Care - PPO | Admitting: Family Medicine

## 2018-02-21 ENCOUNTER — Encounter: Payer: Self-pay | Admitting: Family Medicine

## 2018-02-21 ENCOUNTER — Ambulatory Visit (INDEPENDENT_AMBULATORY_CARE_PROVIDER_SITE_OTHER): Payer: Commercial Managed Care - PPO

## 2018-02-21 VITALS — BP 120/76 | HR 70 | Temp 98.1°F | Resp 12 | Ht 61.0 in | Wt 161.0 lb

## 2018-02-21 DIAGNOSIS — Z720 Tobacco use: Secondary | ICD-10-CM | POA: Diagnosis not present

## 2018-02-21 DIAGNOSIS — Z13228 Encounter for screening for other metabolic disorders: Secondary | ICD-10-CM | POA: Diagnosis not present

## 2018-02-21 DIAGNOSIS — M25512 Pain in left shoulder: Secondary | ICD-10-CM

## 2018-02-21 DIAGNOSIS — Z9189 Other specified personal risk factors, not elsewhere classified: Secondary | ICD-10-CM | POA: Diagnosis not present

## 2018-02-21 DIAGNOSIS — Z1329 Encounter for screening for other suspected endocrine disorder: Secondary | ICD-10-CM

## 2018-02-21 DIAGNOSIS — Z23 Encounter for immunization: Secondary | ICD-10-CM | POA: Diagnosis not present

## 2018-02-21 DIAGNOSIS — E785 Hyperlipidemia, unspecified: Secondary | ICD-10-CM

## 2018-02-21 DIAGNOSIS — Z130 Encounter for screening for diseases of the blood and blood-forming organs and certain disorders involving the immune mechanism: Secondary | ICD-10-CM | POA: Diagnosis not present

## 2018-02-21 DIAGNOSIS — Z0000 Encounter for general adult medical examination without abnormal findings: Secondary | ICD-10-CM

## 2018-02-21 DIAGNOSIS — M19012 Primary osteoarthritis, left shoulder: Secondary | ICD-10-CM | POA: Diagnosis not present

## 2018-02-21 DIAGNOSIS — F419 Anxiety disorder, unspecified: Secondary | ICD-10-CM

## 2018-02-21 DIAGNOSIS — K219 Gastro-esophageal reflux disease without esophagitis: Secondary | ICD-10-CM | POA: Diagnosis not present

## 2018-02-21 DIAGNOSIS — Z1159 Encounter for screening for other viral diseases: Secondary | ICD-10-CM

## 2018-02-21 LAB — LIPID PANEL
CHOLESTEROL: 222 mg/dL — AB (ref 0–200)
HDL: 54.2 mg/dL (ref >39.00)
LDL Cholesterol: 146 mg/dL — ABNORMAL HIGH (ref 0–99)
NonHDL: 168.23
Total CHOL/HDL Ratio: 4
Triglycerides: 109 mg/dL (ref 0.0–149.0)
VLDL: 21.8 mg/dL (ref 0.0–40.0)

## 2018-02-21 LAB — BASIC METABOLIC PANEL
BUN: 13 mg/dL (ref 6–23)
CO2: 29 mEq/L (ref 19–32)
CREATININE: 0.72 mg/dL (ref 0.40–1.20)
Calcium: 9.6 mg/dL (ref 8.4–10.5)
Chloride: 101 mEq/L (ref 96–112)
GFR: 81.34 mL/min (ref >60.00)
Glucose, Bld: 96 mg/dL (ref 70–99)
Potassium: 4.2 mEq/L (ref 3.5–5.1)
Sodium: 138 mEq/L (ref 135–145)

## 2018-02-21 MED ORDER — VARENICLINE TARTRATE 0.5 MG X 11 & 1 MG X 42 PO MISC: ORAL | 0 refills | Status: DC

## 2018-02-21 NOTE — Patient Instructions (Addendum)
A few things to remember from today's visit:   Routine general medical examination at a health care facility  Hyperlipidemia, unspecified hyperlipidemia type - Plan: Lipid panel  Left shoulder pain, unspecified chronicity - Plan: DG Shoulder Left  Encounter for HCV screening test for high risk patient - Plan: Hepatitis C antibody screen  Screening for endocrine, metabolic and immunity disorder - Plan: Basic metabolic panel  Tobacco use  Gastroesophageal reflux disease without esophagitis  Today you have you routine preventive visit.  At least 150 minutes of moderate exercise per week, daily brisk walking for 15-30 min is a good exercise option. Healthy diet low in saturated (animal) fats and sweets and consisting of fresh fruits and vegetables, lean meats such as fish and white chicken and whole grains.  These are some of recommendations for screening depending of age and risk factors:   - Vaccines:  Tdap vaccine every 10 years.  Shingles vaccine recommended at age 31, could be given after 65 years of age but not sure about insurance coverage.   Pneumonia vaccines:  Prevnar 13 at 65 and Pneumovax at 55. Sometimes Pneumovax is giving earlier if history of smoking, lung disease,diabetes,kidney disease among some.    Screening for diabetes at age 3 and every 3 years.  Cervical cancer prevention:  Pap smear starts at 65 years of age and continues periodically until 65 years old in low risk women. Pap smear every 3 years between 96 and 72 years old. Pap smear every 3-5 years between women 57 and older if pap smear negative and HPV screening negative.   -Breast cancer: Mammogram: There is disagreement between experts about when to start screening in low risk asymptomatic female but recent recommendations are to start screening at 49 and not later than 65 years old , every 1-2 years and after 65 yo q 2 years. Screening is recommended until 65 years old but some women can continue  screening depending of healthy issues.   Colon cancer screening: starts at 65 years old until 65 years old.   Also recommended:  1. Dental visit- Brush and floss your teeth twice daily; visit your dentist twice a year. 2. Eye doctor- Get an eye exam at least every 2 years. 3. Helmet use- Always wear a helmet when riding a bicycle, motorcycle, rollerblading or skateboarding. 4. Safe sex- If you may be exposed to sexually transmitted infections, use a condom. 5. Seat belts- Seat belts can save your live; always wear one. 6. Smoke/Carbon Monoxide detectors- These detectors need to be installed on the appropriate level of your home. Replace batteries at least once a year. 7. Skin cancer- When out in the sun please cover up and use sunscreen 15 SPF or higher. 8. Violence- If anyone is threatening or hurting you, please tell your healthcare provider.  9. Drink alcohol in moderation- Limit alcohol intake to one drink or less per day. Never drink and drive.  Please be sure medication list is accurate. If a new problem present, please set up appointment sooner than planned today.

## 2018-02-21 NOTE — Progress Notes (Signed)
HPI:   Lynn Bautista is a 63 y.o. female, who is here today for her routine physical.  Last CPE: 07/2015.  Regular exercise 3 or more time per week: She has not been consistent, "on and off." Following a healthy diet: Yes. She lives with her granddaughter.  Chronic medical problems: HLD,anxiety,GERD,and tobacco use.  She drinks around 10 oz of wine daily.  Pap smear : S/P hysterectomy due to fibroid tumors. Hx of abnormal pap smears: Denies, she has Hx of HPV.   Immunization History  Administered Date(s) Administered  . Influenza-Unspecified 11/05/2016  . Tdap 02/21/2018    Mammogram: 10/2017 Colonoscopy: at age 53. DEXA: 09/2015.  Hep C screening: Not done before.  Concerns today: Left shoulder pain , which has been going on for a while. Limitation of ROM due to pain. No Hx of trauma. Pain is getting worse.  She has not tried OTC medications.  GERD: She is on Omeprazole 20 mg daily prn.  Denies abdominal pain, nausea, vomiting, changes in bowel habits, blood in stool or melena.  Anxiety, she is on Effexor XR 75 mg daily. Denies depressed mood or suicidal ideation. She has been on medication since 2004.   HLD: She is on Atorvastatin 10 mg daily. She tries to follow a low fat diet. No side effects from medication.  Component     Latest Ref Rng & Units 11/02/2017  Cholesterol     0 - 200 mg/dL 187  Triglycerides     0.0 - 149.0 mg/dL 110.0  HDL Cholesterol     >39.00 mg/dL 54.30  VLDL     0.0 - 40.0 mg/dL 22.0  LDL (calc)     0 - 99 mg/dL 111 (H)  Total CHOL/HDL Ratio      3  NonHDL      132.97   Tobacco use,she took chantix for 1-2 months. She stopped smoking for 2 months.    Review of Systems  Constitutional: Negative for appetite change, fatigue, fever and unexpected weight change.  HENT: Negative for dental problem, hearing loss, nosebleeds, sore throat, trouble swallowing and voice change.   Eyes: Negative for photophobia and  visual disturbance.  Respiratory: Negative for cough, shortness of breath and wheezing.   Cardiovascular: Negative for chest pain and leg swelling.  Gastrointestinal: Negative for abdominal pain, blood in stool, nausea and vomiting.       No changes in bowel habits.  Endocrine: Negative for cold intolerance, heat intolerance, polydipsia, polyphagia and polyuria.  Genitourinary: Negative for decreased urine volume, dysuria, hematuria, menstrual problem, vaginal bleeding and vaginal discharge.       No breast tenderness or nipple discharge.  Musculoskeletal: Positive for arthralgias. Negative for back pain.  Skin: Negative for rash.  Neurological: Negative for syncope, weakness, numbness and headaches.  Hematological: Negative for adenopathy. Does not bruise/bleed easily.  Psychiatric/Behavioral: Negative for confusion and sleep disturbance. The patient is not nervous/anxious.   All other systems reviewed and are negative.     Current Outpatient Medications on File Prior to Visit  Medication Sig Dispense Refill  . atorvastatin (LIPITOR) 10 MG tablet Take 1 tablet (10 mg total) by mouth daily. 90 tablet 3  . omeprazole (PRILOSEC) 20 MG capsule TAKE 1 CAPSULE (20 MG TOTAL) BY MOUTH DAILY. 90 capsule 1  . venlafaxine XR (EFFEXOR-XR) 75 MG 24 hr capsule TAKE 1 CAPSULE (75 MG TOTAL) BY MOUTH DAILY WITH BREAKFAST. 90 capsule 2  . Zoster Vaccine Adjuvanted [facility]) injection  0.5 ml in muscle and repeat in 8 weeks (Patient not taking: Reported on 02/21/2018) 0.5 mL 1   No current facility-administered medications on file prior to visit.      Past Medical History:  Diagnosis Date  . Anxiety   . GERD (gastroesophageal reflux disease)     Past Surgical History:  Procedure Laterality Date  . ABDOMINAL HYSTERECTOMY    . APPENDECTOMY    . BREAST DUCTAL SYSTEM EXCISION Right 01/02/2015   Procedure: RIGHT BREAST CENTRAL DUCT EXCISION;  Surgeon: Erroll Luna, MD;  Location: [facility];  Service: General;  Laterality: Right;  . BREAST EXCISIONAL BIOPSY Right    benign  . BUNIONECTOMY Bilateral   . CHOLECYSTECTOMY    . TONSILLECTOMY      No Known Allergies  Family History  Problem Relation Age of Onset  . Cancer Mother        Uterine and Breast  . Heart attack Mother   . Hyperlipidemia Mother   . Breast cancer Mother 32  . Cancer Father        prostate cancer  . Heart attack Brother 65  . Hyperlipidemia Brother   . Hyperlipidemia Daughter     Social History   Socioeconomic History  . Marital status: Divorced    Spouse name: Not on file  . Number of children: 2  . Years of education: Not on file  . Highest education level: Not on file  Occupational History  . Occupation: Facilities manager: [employer]  . Financial resource strain: Not on file  . Food insecurity:    Worry: Not on file    Inability: Not on file  . Transportation needs:    Medical: Not on file    Non-medical: Not on file  Tobacco Use  . Smoking status: Current Every Day Smoker    Packs/day: 0.50    Start date: 04/27/1968  . Smokeless tobacco: Never Used  Substance and Sexual Activity  . Alcohol use: Yes    Alcohol/week: 8.0 standard drinks    Types: 8 Standard drinks or equivalent per week    Comment: social  . Drug use: No  . Sexual activity: Not on file  Lifestyle  . Physical activity:    Days per week: Not on file    Minutes per session: Not on file  . Stress: Not on file  Relationships  . Social connections:    Talks on phone: Not on file    Gets together: Not on file    Attends religious service: Not on file    Active member of club or organization: Not on file    Attends meetings of clubs or organizations: Not on file    Relationship status: Not on file  Other Topics Concern  . Not on file  Social History Narrative  . Not on file     Vitals:   02/21/18 0854  BP: 120/76  Pulse: 70  Resp: 12  Temp: 98.1 F (36.7 C)    SpO2: 99%   Body mass index is 30.42 kg/m.   Wt Readings from Last 3 Encounters:  02/21/18 161 lb (73 kg)  11/02/17 160 lb 6.4 oz (72.8 kg)  07/26/17 156 lb (70.8 kg)     Physical Exam  Nursing note and vitals reviewed. Constitutional: She is oriented to person, place, and time. She appears well-developed. No distress.  HENT:  Head: Normocephalic and atraumatic.  Right Ear: Hearing, tympanic membrane,  external ear and ear canal normal.  Left Ear: Hearing, tympanic membrane, external ear and ear canal normal.  Mouth/Throat: Uvula is midline, oropharynx is clear and moist and mucous membranes are normal.  Eyes: Pupils are equal, round, and reactive to light. Conjunctivae and EOM are normal.  Neck: No tracheal deviation present. No thyromegaly present.  Cardiovascular: Normal rate and regular rhythm.  No murmur heard. Pulses:      Dorsalis pedis pulses are 2+ on the right side and 2+ on the left side.  Respiratory: Effort normal and breath sounds normal. No respiratory distress.  GI: Soft. She exhibits no mass. There is no hepatomegaly. There is no abdominal tenderness.  Genitourinary:    Genitourinary Comments: Breast: No masses, skin abnormalities, or nipple discharge appreciated bilateral.   Musculoskeletal:        General: No edema.     Left shoulder: She exhibits decreased range of motion and tenderness. She exhibits no bony tenderness.     Comments: No signs of synovitis appreciated.  Lymphadenopathy:    She has no cervical adenopathy.       Right: No supraclavicular adenopathy present.       Left: No supraclavicular adenopathy present.  Neurological: She is alert and oriented to person, place, and time. She has normal strength. No cranial nerve deficit. Coordination and gait normal.  Reflex Scores:      Bicep reflexes are 2+ on the right side and 2+ on the left side.      Patellar reflexes are 2+ on the right side and 2+ on the left side. Skin: Skin is warm. No rash  noted. No erythema.  Psychiatric: She has a normal mood and affect. Cognition and memory are normal.  Well groomed, good eye contact.    ASSESSMENT AND PLAN:  Ms. Shadia Larose was here today annual physical examination and follow up.   Orders Placed This Encounter  Procedures  . DG Shoulder Left  . Tdap vaccine greater than or equal to 7yo IM  . Basic metabolic panel  . Lipid panel  . Hepatitis C antibody screen    Lab Results  Component Value Date   CHOL 222 (H) 02/21/2018   HDL 54.20 02/21/2018   LDLCALC 146 (H) 02/21/2018   TRIG 109.0 02/21/2018   CHOLHDL 4 02/21/2018   Lab Results  Component Value Date   CREATININE 0.72 02/21/2018   BUN 13 02/21/2018   NA 138 02/21/2018   K 4.2 02/21/2018   CL 101 02/21/2018   CO2 29 02/21/2018      Routine general medical examination at a health care facility We discussed the importance of regular physical activity and healthy diet for prevention of chronic illness and/or complications. Preventive guidelines reviewed. Vaccination updated.  Ca++ and vit D supplementation recommended. Next CPE in a year.  Hyperlipidemia, unspecified hyperlipidemia type No changes in current management, will follow labs done today and will give further recommendations accordingly.  -     Lipid panel  Left shoulder pain, unspecified chronicity ? Rotator cuff tendinitis vs OA. She want to hold on PT. ROM exercises recommended. Further recommendations according to imaging results.  -     DG Shoulder Left; Future  Encounter for HCV screening test for high risk patient -     Hepatitis C antibody screen  Screening for endocrine, metabolic and immunity disorder -     Basic metabolic panel  Tobacco use She would like to try Chantix again. Adverse effects of tobacco use  and benefits of smoking cessation discussed.  -     varenicline (CHANTIX PAK) 0.5 MG X 11 & 1 MG X 42 tablet; Take one 0.5 mg tablet by mouth once daily for 3 days,  then increase to one 0.5 mg tablet twice daily for 4 days, then increase to one 1 mg tablet twice daily.  Gastroesophageal reflux disease without esophagitis Stable. No changes in Omeprazole 20 mg. GERD precautions also recommended.  Anxiety Well controlled with Effexor XR 75 mg daily. Instructed about warning signs. Given the fact problem has been stable for years,I think it is appropriate to continue following annually.  Other orders -     Tdap vaccine greater than or equal to 7yo IM      Return in 1 year (on 02/22/2019) for cpe and f/u.       Darnelle Corp G. Martinique, MD  [facility]. [place] office.

## 2018-02-22 LAB — HEPATITIS C ANTIBODY
HEP C AB: NONREACTIVE
SIGNAL TO CUT-OFF: 0.1 (ref <1.00)

## 2018-02-24 ENCOUNTER — Encounter: Payer: Self-pay | Admitting: Family Medicine

## 2018-02-27 ENCOUNTER — Other Ambulatory Visit: Payer: Self-pay | Admitting: Family Medicine

## 2018-03-15 DIAGNOSIS — L570 Actinic keratosis: Secondary | ICD-10-CM | POA: Diagnosis not present

## 2018-03-15 DIAGNOSIS — L821 Other seborrheic keratosis: Secondary | ICD-10-CM | POA: Diagnosis not present

## 2018-03-15 DIAGNOSIS — D229 Melanocytic nevi, unspecified: Secondary | ICD-10-CM | POA: Diagnosis not present

## 2018-03-21 ENCOUNTER — Other Ambulatory Visit: Payer: Self-pay | Admitting: Family Medicine

## 2018-03-21 DIAGNOSIS — Z720 Tobacco use: Secondary | ICD-10-CM

## 2018-03-31 DIAGNOSIS — L570 Actinic keratosis: Secondary | ICD-10-CM | POA: Diagnosis not present

## 2018-06-23 ENCOUNTER — Ambulatory Visit: Payer: Self-pay | Admitting: *Deleted

## 2018-06-23 NOTE — Telephone Encounter (Signed)
Pt reports headache "Shooting pain, brief, only one." States has these several times a year,not every year, last one 2 months ago. States pain shoots through top of head, right sided. Reports with episode today "Eyes, vision fluttered for about 15 minutes.Like wavy lines." States first time that occurred with episodes. Pts Email and Ph # verified. [place] called practice, [facility], for consideration of appt.Marland Kitchen CAre advise given.   Reason for Disposition . [1] New headache AND [2] age > 35  Answer Assessment - Initial Assessment Questions 1. LOCATION: "Where does it hurt?"     Top of head, right side. 2. ONSET: "When did the headache start?" (Minutes, hours or days)      This AM, Brief, shooting pain 3. PATTERN: "Does the pain come and go, or has it been constant since it started?"     Happens couple times a year. Not every year. 4. SEVERITY: "How bad is the pain?" and "What does it keep you from doing?"  (e.g., Scale 1-10; mild, moderate, or severe)   - MILD (1-3): doesn't interfere with normal activities    - MODERATE (4-7): interferes with normal activities or awakens from sleep    - SEVERE (8-10): excruciating pain, unable to do any normal activities       ONe shooting pain 5. RECURRENT SYMPTOM: "Have you ever had headaches before?" If so, ask: "When was the last time?" and "What happened that time?"      Yes, last time 2 months ago 6. CAUSE: "What do you think is causing the headache?"     Unsure 7. MIGRAINE: "Have you been diagnosed with migraine headaches?" If so, ask: "Is this headache similar?"      Yes from 54yrs ago until 34s. 8. HEAD INJURY: "Has there been any recent injury to the head?"      no 9. OTHER SYMPTOMS: "Do you have any other symptoms?" (fever, stiff neck, eye pain, sore throat, cold symptoms)    Fluttering in both eyes, duration 15 minutes, first episode  Protocols used: HEADACHE-A-AH

## 2018-06-24 ENCOUNTER — Encounter: Payer: Self-pay | Admitting: Family Medicine

## 2018-06-24 ENCOUNTER — Ambulatory Visit (INDEPENDENT_AMBULATORY_CARE_PROVIDER_SITE_OTHER): Payer: Commercial Managed Care - PPO | Admitting: Family Medicine

## 2018-06-24 ENCOUNTER — Other Ambulatory Visit: Payer: Self-pay

## 2018-06-24 VITALS — BP 120/70 | HR 76 | Temp 98.6°F | Resp 12 | Ht 61.0 in | Wt 162.2 lb

## 2018-06-24 DIAGNOSIS — Z720 Tobacco use: Secondary | ICD-10-CM | POA: Diagnosis not present

## 2018-06-24 DIAGNOSIS — R519 Headache, unspecified: Secondary | ICD-10-CM

## 2018-06-24 DIAGNOSIS — R51 Headache: Secondary | ICD-10-CM | POA: Diagnosis not present

## 2018-06-24 NOTE — Patient Instructions (Addendum)
A few things to remember from today's visit:   Nonintractable episodic headache, unspecified headache type - Plan: MR Brain W Wo Contrast   General Headache Without Cause A headache is pain or discomfort that is felt around the head or neck area. There are many causes and types of headaches. In some cases, the cause may not be found. Follow these instructions at home: Watch your condition for any changes. Let your doctor know about them. Take these steps to help with your condition: Managing pain      Take over-the-counter and prescription medicines only as told by your doctor.  Lie down in a dark, quiet room when you have a headache.  If told, put ice on your head and neck area: ? Put ice in a plastic bag. ? Place a towel between your skin and the bag. ? Leave the ice on for 20 minutes, 2-3 times per day.  If told, put heat on the affected area. Use the heat source that your doctor recommends, such as a moist heat pack or a heating pad. ? Place a towel between your skin and the heat source. ? Leave the heat on for 20-30 minutes. ? Remove the heat if your skin turns bright red. This is very important if you are unable to feel pain, heat, or cold. You may have a greater risk of getting burned.  Keep lights dim if bright lights bother you or make your headaches worse. Eating and drinking  Eat meals on a regular schedule.  If you drink alcohol: ? Limit how much you use to:  0-1 drink a day for women.  0-2 drinks a day for men. ? Be aware of how much alcohol is in your drink. In the [place] one drink equals one 12 oz bottle of beer (355 mL), one 5 oz glass of wine (148 mL), or one 1 oz glass of hard liquor (44 mL).  Stop drinking caffeine, or reduce how much caffeine you drink. General instructions   Keep a journal to find out if certain things bring on headaches. For example, write down: ? What you eat and drink. ? How much sleep you get. ? Any change to your diet or  medicines.  Get a massage or try other ways to relax.  Limit stress.  Sit up straight. Do not tighten (tense) your muscles.  Do not use any products that contain nicotine or tobacco. This includes cigarettes, e-cigarettes, and chewing tobacco. If you need help quitting, ask your doctor.  Exercise regularly as told by your doctor.  Get enough sleep. This often means 7-9 hours of sleep each night.  Keep all follow-up visits as told by your doctor. This is important. Contact a doctor if:  Your symptoms are not helped by medicine.  You have a headache that feels different than the other headaches.  You feel sick to your stomach (nauseous) or you throw up (vomit).  You have a fever. Get help right away if:  Your headache gets very bad quickly.  Your headache gets worse after a lot of physical activity.  You keep throwing up.  You have a stiff neck.  You have trouble seeing.  You have trouble speaking.  You have pain in the eye or ear.  Your muscles are weak or you lose muscle control.  You lose your balance or have trouble walking.  You feel like you will pass out (faint) or you pass out.  You are mixed up (confused).  You have  a seizure. Summary  A headache is pain or discomfort that is felt around the head or neck area.  There are many causes and types of headaches. In some cases, the cause may not be found.  Keep a journal to help find out what causes your headaches. Watch your condition for any changes. Let your doctor know about them.  Contact a doctor if you have a headache that is different from usual, or if your headache is not helped by medicine.  Get help right away if your headache gets very bad, you throw up, you have trouble seeing, you lose your balance, or you have a seizure. This information is not intended to replace advice given to you by your health care provider. Make sure you discuss any questions you have with your health care  provider. Document Released: 10/29/2007 Document Revised: 08/09/2017 Document Reviewed: 08/09/2017 Elsevier Interactive Patient Education  2019 [employer].  Please be sure medication list is accurate. If a new problem present, please set up appointment sooner than planned today.

## 2018-06-24 NOTE — Progress Notes (Signed)
ACUTE VISIT   HPI:  Chief Complaint  Patient presents with  . Pain in head    pain in head that feels like a knife at times that causes vision issues. Has happened previously.    [name] is a 63 y.o. female, who is here today complaining of headache. For a few years she has had intermittent episodes of headaches. Sudden onset of sharp,severe pain right parietal to fronto temporal area. She had 2 similar episode yesterday while she was in a meeting,first one was 100/10 sharp pain,lasted about 15 seconds or less, then she had a second one but milder.  She now has a residual fronto temporal 5/10 dull headache. Mild lightheadedness.  She has 1-2 episodes per year.  This time she noted "butterflies" described as zigzag lines on peripheral visual field after headache onset that lasted about 15 min; this is a new symptom.  She has not identified exacerbating or alleviating factors. She took Excedrin Migraine at work, usually she takes ibuprofen and it helps.  She denies associated nausea, vomiting, or photophobia. Negative for head trauma,facial pain,epiphora,conjunctival erythema,or nasal congestion.  Past Hx of migraines from age 54 to 18. She was evaluated by neurologist in her 20's because one headache lasted 30 days.  Still smoking,tried Chantix ,which helped for a few weeks. Problem exacerbated by stress.   Review of Systems  Constitutional: Negative for fatigue.  Cardiovascular: Negative for palpitations and leg swelling.  Musculoskeletal: Negative for neck pain and neck stiffness.  Skin: Negative for rash.  Neurological: Negative for seizures, syncope and facial asymmetry.  Psychiatric/Behavioral: Negative for confusion and sleep disturbance. The patient is not nervous/anxious.   Rest of ROS,pertinent positives and negatives in HPI.  Current Outpatient Medications on File Prior to Visit  Medication Sig Dispense Refill  . atorvastatin (LIPITOR) 10 MG  tablet Take 1 tablet (10 mg total) by mouth daily. 90 tablet 3  . omeprazole (PRILOSEC) 20 MG capsule TAKE 1 CAPSULE BY MOUTH EVERY DAY 90 capsule 1  . varenicline (CHANTIX PAK) 0.5 MG X 11 & 1 MG X 42 tablet Take one 0.5 mg tablet by mouth once daily for 3 days, then increase to one 0.5 mg tablet twice daily for 4 days, then increase to one 1 mg tablet twice daily. 53 tablet 0  . venlafaxine XR (EFFEXOR-XR) 75 MG 24 hr capsule TAKE 1 CAPSULE (75 MG TOTAL) BY MOUTH DAILY WITH BREAKFAST. 90 capsule 2  . Zoster Vaccine Adjuvanted [facility]) injection 0.5 ml in muscle and repeat in 8 weeks (Patient not taking: Reported on 06/24/2018) 0.5 mL 1   No current facility-administered medications on file prior to visit.    Past Medical History:  Diagnosis Date  . Anxiety   . GERD (gastroesophageal reflux disease)    No Known Allergies  Social History   Socioeconomic History  . Marital status: Divorced    Spouse name: Not on file  . Number of children: 2  . Years of education: Not on file  . Highest education level: Not on file  Occupational History  . Occupation: Facilities manager: [employer]  . Financial resource strain: Not on file  . Food insecurity:    Worry: Not on file    Inability: Not on file  . Transportation needs:    Medical: Not on file    Non-medical: Not on file  Tobacco Use  . Smoking status: Current Every Day Smoker  Packs/day: 0.50    Start date: 04/27/1968  . Smokeless tobacco: Never Used  Substance and Sexual Activity  . Alcohol use: Yes    Alcohol/week: 8.0 standard drinks    Types: 8 Standard drinks or equivalent per week    Comment: social  . Drug use: No  . Sexual activity: Not on file  Lifestyle  . Physical activity:    Days per week: Not on file    Minutes per session: Not on file  . Stress: Not on file  Relationships  . Social connections:    Talks on phone: Not on file    Gets together: Not on file    Attends  religious service: Not on file    Active member of club or organization: Not on file    Attends meetings of clubs or organizations: Not on file    Relationship status: Not on file  Other Topics Concern  . Not on file  Social History Narrative  . Not on file    Vitals:   06/24/18 1050  BP: 120/70  Pulse: 76  Resp: 12  Temp: 98.6 F (37 C)  SpO2: 93%   Body mass index is 30.66 kg/m.  Physical Exam  Nursing note and vitals reviewed. Constitutional: She is oriented to person, place, and time. She appears well-developed. No distress.  HENT:  Head: Normocephalic and atraumatic.  Mouth/Throat: Oropharynx is clear and moist and mucous membranes are normal.  Eyes: Pupils are equal, round, and reactive to light. Conjunctivae and EOM are normal.  Fundoscopic exam:      The right eye shows no exudate, no hemorrhage and no papilledema.       The left eye shows no exudate, no hemorrhage and no papilledema.  Cardiovascular: Normal rate and regular rhythm.  No murmur heard. Respiratory: Effort normal and breath sounds normal. No respiratory distress.  GI: Soft. She exhibits no mass. There is no hepatomegaly. There is no abdominal tenderness.  Musculoskeletal:        General: No edema.  Lymphadenopathy:    She has no cervical adenopathy.  Neurological: She is alert and oriented to person, place, and time. She has normal strength. No cranial nerve deficit. Gait normal.  Reflex Scores:      Bicep reflexes are 2+ on the right side and 2+ on the left side.      Patellar reflexes are 2+ on the right side and 2+ on the left side. Skin: Skin is warm. No rash noted. No erythema.  Psychiatric: She has a normal mood and affect.  Well groomed, good eye contact.    ASSESSMENT AND PLAN:  [name] was seen today for pain in head.  Diagnoses and all orders for this visit:  Nonintractable episodic headache, unspecified headache type We discussed possible etiologies: Migraine,tension like  headache,and less likely cluster headache. She is not interested in Toradol here in the office. She can continue Advil 400 mg tid as needed,instructed to take it with food. Clearly instructed about warning signs.  -     MR Brain W Wo Contrast; Future  Tobacco use Adverse effects of tobacco and benefits of smoking cessation discussed. For now she is not interested in trying other pharmacologic treatments for smoking cessation.   Return if symptoms worsen or fail to improve.   -[name] was advised to seek immediate medical attention if sudden worsening symptoms.    Asriel Westrup G. Martinique, MD  [facility]. [place] office.

## 2018-09-07 ENCOUNTER — Other Ambulatory Visit: Payer: Self-pay | Admitting: Family Medicine

## 2018-10-09 ENCOUNTER — Other Ambulatory Visit: Payer: Self-pay | Admitting: Family Medicine

## 2018-10-09 DIAGNOSIS — F419 Anxiety disorder, unspecified: Secondary | ICD-10-CM

## 2018-10-11 NOTE — Telephone Encounter (Signed)
Routing to Dr. Jordan for approval  

## 2019-03-26 ENCOUNTER — Ambulatory Visit: Payer: Medicare Other | Attending: Internal Medicine

## 2019-03-26 DIAGNOSIS — Z23 Encounter for immunization: Secondary | ICD-10-CM | POA: Insufficient documentation

## 2019-03-26 NOTE — Progress Notes (Signed)
   Covid-19 Vaccination Clinic  Name:  Lynn Bautista    MRN: [id number] DOB: 1955/11/23  03/26/2019  Lynn Bautista was observed post Covid-19 immunization for 15 minutes without incidence. She was provided with Vaccine Information Sheet and instruction to access the V-Safe system.   Lynn Bautista was instructed to call 911 with any severe reactions post vaccine: Marland Kitchen Difficulty breathing  . Swelling of your face and throat  . A fast heartbeat  . A bad rash all over your body  . Dizziness and weakness    Immunizations Administered    Name Date Dose VIS Date Route   Pfizer COVID-19 Vaccine 03/26/2019 12:51 PM 0.3 mL 01/13/2019 Intramuscular   Manufacturer: [place]   Lot: [id number]   [place]: [id number]

## 2019-04-17 ENCOUNTER — Encounter: Payer: Self-pay | Admitting: Family Medicine

## 2019-04-17 ENCOUNTER — Telehealth (INDEPENDENT_AMBULATORY_CARE_PROVIDER_SITE_OTHER): Payer: Medicare Other | Admitting: Family Medicine

## 2019-04-17 VITALS — Ht 61.0 in | Wt 157.0 lb

## 2019-04-17 DIAGNOSIS — F419 Anxiety disorder, unspecified: Secondary | ICD-10-CM

## 2019-04-17 DIAGNOSIS — R1031 Right lower quadrant pain: Secondary | ICD-10-CM

## 2019-04-17 DIAGNOSIS — K219 Gastro-esophageal reflux disease without esophagitis: Secondary | ICD-10-CM | POA: Diagnosis not present

## 2019-04-17 DIAGNOSIS — E785 Hyperlipidemia, unspecified: Secondary | ICD-10-CM | POA: Diagnosis not present

## 2019-04-17 MED ORDER — OMEPRAZOLE 20 MG PO CPDR: 20.0000 mg | DELAYED_RELEASE_CAPSULE | Freq: Every day | ORAL | 2 refills | Status: DC | PRN

## 2019-04-17 MED ORDER — VENLAFAXINE HCL ER 75 MG PO CP24: ORAL_CAPSULE | ORAL | 3 refills | Status: DC
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
## 2019-04-17 NOTE — Assessment & Plan Note (Signed)
Problem is well controlled. Continue Effexor XR 75 mg daily. Follow-up in 12 months, before if needed.

## 2019-04-17 NOTE — Assessment & Plan Note (Signed)
Problem is stable. Continue omeprazole 20 mg daily as needed. GERD precautions to continue. As far as problem is is stable, annual follow-up is appropriate.

## 2019-04-17 NOTE — Assessment & Plan Note (Signed)
She will be here for labs tomorrow. Further recommendation will be given according to 10 years CVD score + lipid panel numbers.

## 2019-04-18 ENCOUNTER — Other Ambulatory Visit (INDEPENDENT_AMBULATORY_CARE_PROVIDER_SITE_OTHER): Payer: Medicare Other

## 2019-04-18 ENCOUNTER — Other Ambulatory Visit: Payer: Self-pay

## 2019-04-18 DIAGNOSIS — E785 Hyperlipidemia, unspecified: Secondary | ICD-10-CM

## 2019-04-18 LAB — COMPREHENSIVE METABOLIC PANEL
ALT: 38 U/L — ABNORMAL HIGH (ref 0–35)
AST: 17 U/L (ref 0–37)
Albumin: 4.1 g/dL (ref 3.5–5.2)
Alkaline Phosphatase: 84 U/L (ref 39–117)
BUN: 14 mg/dL (ref 6–23)
CO2: 26 mEq/L (ref 19–32)
Calcium: 9.3 mg/dL (ref 8.4–10.5)
Chloride: 105 mEq/L (ref 96–112)
Creatinine, Ser: 0.76 mg/dL (ref 0.40–1.20)
GFR: 76.15 mL/min (ref >60.00)
Glucose, Bld: 108 mg/dL — ABNORMAL HIGH (ref 70–99)
Potassium: 3.8 mEq/L (ref 3.5–5.1)
Sodium: 140 mEq/L (ref 135–145)
Total Bilirubin: 0.5 mg/dL (ref 0.2–1.2)
Total Protein: 6.7 g/dL (ref 6.0–8.3)

## 2019-04-18 LAB — LIPID PANEL
Cholesterol: 262 mg/dL — ABNORMAL HIGH (ref 0–200)
HDL: 45 mg/dL (ref >39.00)
LDL Cholesterol: 194 mg/dL — ABNORMAL HIGH (ref 0–99)
NonHDL: 216.93
Total CHOL/HDL Ratio: 6
Triglycerides: 114 mg/dL (ref 0.0–149.0)
VLDL: 22.8 mg/dL (ref 0.0–40.0)

## 2019-04-18 MED ORDER — ATORVASTATIN CALCIUM 40 MG PO TABS: 40.0000 mg | ORAL_TABLET | Freq: Every day | ORAL | 3 refills | Status: DC

## 2019-04-19 ENCOUNTER — Ambulatory Visit: Payer: Medicare Other | Attending: Internal Medicine

## 2019-04-19 ENCOUNTER — Other Ambulatory Visit: Payer: Self-pay

## 2019-04-19 DIAGNOSIS — Z23 Encounter for immunization: Secondary | ICD-10-CM

## 2019-04-19 DIAGNOSIS — E785 Hyperlipidemia, unspecified: Secondary | ICD-10-CM

## 2019-04-19 NOTE — Progress Notes (Signed)
   Covid-19 Vaccination Clinic  Name:  Lynn Bautista    MRN: [id number] DOB: 1955-06-28  04/19/2019  [name] was observed post Covid-19 immunization for 15 minutes without incident. She was provided with Vaccine Information Sheet and instruction to access the V-Safe system.   [name] was instructed to call 911 with any severe reactions post vaccine: Marland Kitchen Difficulty breathing  . Swelling of face and throat  . A fast heartbeat  . A bad rash all over body  . Dizziness and weakness   Immunizations Administered    Name Date Dose VIS Date Route   Pfizer COVID-19 Vaccine 04/19/2019  1:22 PM 0.3 mL 01/13/2019 Intramuscular   Manufacturer: [place]   Lot: [id number]   [place]: [id number]

## 2019-04-20 ENCOUNTER — Ambulatory Visit: Payer: Self-pay

## 2019-04-20 ENCOUNTER — Encounter: Payer: Self-pay | Admitting: Family Medicine

## 2019-04-20 ENCOUNTER — Other Ambulatory Visit: Payer: Self-pay

## 2019-04-20 ENCOUNTER — Ambulatory Visit (INDEPENDENT_AMBULATORY_CARE_PROVIDER_SITE_OTHER): Payer: Medicare Other | Admitting: Family Medicine

## 2019-04-20 DIAGNOSIS — R1031 Right lower quadrant pain: Secondary | ICD-10-CM | POA: Diagnosis not present

## 2019-04-20 DIAGNOSIS — M545 Low back pain, unspecified: Secondary | ICD-10-CM

## 2019-04-20 MED ORDER — DICLOFENAC SODIUM 1 % EX GEL: 4.0000 g | Freq: Four times a day (QID) | CUTANEOUS | 6 refills | Status: DC | PRN

## 2019-04-20 NOTE — Progress Notes (Signed)
Office Visit Note   Patient: Lynn Bautista           Date of Birth: August 19, 1955           MRN: [id number] Visit Date: 04/20/2019 Requested by: Martinique, Betty G, MD [address] [place],  [place] [address] PCP: Martinique, Betty G, MD  Subjective: Chief Complaint  Patient presents with  . Lower Back - Pain    Pain in right buttock region after bending over to take something out of the oven 5 days ago.   . right groin pain x 1 year    HPI: She is here with right side groin pain.  Symptoms started about a year ago when she began walking for exercise with some friends.  She started noticing intermittent pain in the groin area after walking for a couple miles.  She would rest for a few days and the pain would improve, but it came right back every time she walked.  She eventually rested for 2 months but her pain persisted with certain movements, such as getting in and out of a car or turning over in bed.  She has not been taking medicine for her pain.  She had a similar episode of pain 6 or 7 years ago after doing a lot of walking in [place] but then resolved until recently.  5 days ago she bent forward to get something from the oven and felt immediate pain in her right posterior hip.  It hurts anytime she bends forward, with pain radiating into the buttocks area.  No bowel or bladder dysfunction, no weakness or numbness.               ROS:   All other systems were reviewed and are negative.  Objective: Vital Signs: There were no vitals taken for this visit.  Physical Exam:  General:  Alert and oriented, in no acute distress. Pulm:  Breathing unlabored. Psy:  Normal mood, congruent affect. Skin: No visible rash. Low back: No midline tenderness.  She is tender in the right gluteus medius area and in the sciatic notch.  No pain over the SI joint.  Negative straight leg raise, lower extremity strength and reflexes are normal. Right hip: She has symmetric range of motion compared to  the left, but quite a bit of pain with passive flexion and internal rotation.  Resisted strength testing of her hip in all directions does not bring on any pain.  With female chaperone present, she is tender to palpation on the abductor tendon but has no pain in the tendon when it is flexed actively against resistance.  Imaging: X-rays right hip: She has spurring on the femoral head consistent with osteoarthritis.  Joint space is still well-preserved.  No sign of stress fracture.    Assessment & Plan: 1.  Chronic right groin pain, suspect due to osteoarthritis.  Could be abductor tendinopathy but most of her pain seems to be with passive movements. -Physical therapy referral, Voltaren gel topically.  Could do a diagnostic intra-articular injection if symptoms persist. -Glucosamine and turmeric.  2.  Acute right-sided lumbar strain -Physical therapy, home stretches.  Lumbar x-rays and MRI scan if symptoms persist.     Procedures: No procedures performed  No notes on file     PMFS History: Patient Active Problem List   Diagnosis Date Noted  . Fatty infiltration of liver 11/02/2017  . Primary osteoarthritis of right knee 08/13/2017  . GERD (gastroesophageal reflux disease) 07/04/2015  .  Anxiety 07/04/2015  . Hyperlipidemia 07/04/2015  . Tobacco use 07/04/2015   Past Medical History:  Diagnosis Date  . Anxiety   . GERD (gastroesophageal reflux disease)     Family History  Problem Relation Age of Onset  . Cancer Mother        Uterine and Breast  . Heart attack Mother   . Hyperlipidemia Mother   . Breast cancer Mother 39  . Cancer Father        prostate cancer  . Heart attack Brother 45  . Hyperlipidemia Brother   . Hyperlipidemia Daughter     Past Surgical History:  Procedure Laterality Date  . ABDOMINAL HYSTERECTOMY    . APPENDECTOMY    . BREAST DUCTAL SYSTEM EXCISION Right 01/02/2015   Procedure: RIGHT BREAST CENTRAL DUCT EXCISION;  Surgeon: Erroll Luna, MD;   Location: [place];  Service: General;  Laterality: Right;  . BREAST EXCISIONAL BIOPSY Right    benign  . BUNIONECTOMY Bilateral   . CHOLECYSTECTOMY    . TONSILLECTOMY     Social History   Occupational History  . Occupation: Facilities manager: [employer]  Tobacco Use  . Smoking status: Current Every Day Smoker    Packs/day: 0.50    Start date: 04/27/1968  . Smokeless tobacco: Never Used  Substance and Sexual Activity  . Alcohol use: Yes    Alcohol/week: 8.0 standard drinks    Types: 8 Standard drinks or equivalent per week    Comment: social  . Drug use: No  . Sexual activity: Not on file

## 2019-05-02 ENCOUNTER — Telehealth: Payer: Self-pay | Admitting: Family Medicine

## 2019-05-02 NOTE — Telephone Encounter (Signed)
error 

## 2019-05-10 ENCOUNTER — Telehealth: Payer: Self-pay | Admitting: *Deleted

## 2019-05-10 NOTE — Telephone Encounter (Signed)
Pt presents to office with paperwork to be completed for [facility] concerning metal in her body.

## 2019-05-10 NOTE — Telephone Encounter (Signed)
Left message informing pt there were no records of her being seen in our office and she could pick up the paperwork at the front reception.

## 2019-05-15 ENCOUNTER — Encounter: Payer: Medicare Other | Admitting: Family Medicine

## 2019-05-17 ENCOUNTER — Other Ambulatory Visit: Payer: Self-pay

## 2019-05-17 ENCOUNTER — Ambulatory Visit: Payer: Medicare Other | Attending: Family Medicine | Admitting: Physical Therapy

## 2019-05-17 ENCOUNTER — Encounter: Payer: Self-pay | Admitting: Physical Therapy

## 2019-05-17 DIAGNOSIS — M25551 Pain in right hip: Secondary | ICD-10-CM | POA: Diagnosis not present

## 2019-05-17 DIAGNOSIS — R1031 Right lower quadrant pain: Secondary | ICD-10-CM | POA: Insufficient documentation

## 2019-05-17 NOTE — Therapy (Signed)
[facility] [address] [place], [place], [address] Phone: [phone]   Fax:  [phone]  Physical Therapy Evaluation  Patient Details  Name: Lynn Bautista MRN: [id number] Date of Birth: [birth year] Referring Provider (PT): Eunice Blase, MD   Encounter Date: 05/17/2019  PT End of Session - 05/17/19 1146    Visit Number  1    Date for PT Re-Evaluation  06/28/19    PT Start Time  1146    PT Stop Time  1233    PT Time Calculation (min)  47 min    Activity Tolerance  Patient tolerated treatment well    Behavior During Therapy  [facility] for tasks assessed/performed       Past Medical History:  Diagnosis Date  . Anxiety   . GERD (gastroesophageal reflux disease)     Past Surgical History:  Procedure Laterality Date  . ABDOMINAL HYSTERECTOMY    . APPENDECTOMY    . BREAST DUCTAL SYSTEM EXCISION Right 01/02/2015   Procedure: RIGHT BREAST CENTRAL DUCT EXCISION;  Surgeon: Erroll Luna, MD;  Location: [place];  Service: General;  Laterality: Right;  . BREAST EXCISIONAL BIOPSY Right    benign  . BUNIONECTOMY Bilateral   . CHOLECYSTECTOMY    . TONSILLECTOMY      There were no vitals filed for this visit.   Subjective Assessment - 05/17/19 1148    Subjective  Patient having right groin pain for over a year. Pain in the right groin with active hip ER/ABD. It actually started on the left but that has resolved. Patient had recent episode of acute back pain after reaching in the oven, Pain with flexion when sitting into right gluteals. Resolved after 2 weeks.    How long can you walk comfortably?  0.5 mile or 10 min    Diagnostic tests  xray - mild degenerative changes per pt    Patient Stated Goals  to get rid of the pain and be able to walk and lose weight    Currently in Pain?  Yes    Pain Score  2    5/10 getting out of car, 7/10 after walking   Pain Location  Groin    Pain Orientation  Right     Pain Descriptors / Indicators  --   pull/stretch   Pain Type  Chronic pain    Pain Onset  More than a month ago    Pain Frequency  Constant    Aggravating Factors   walking and hip ABD/ER    Pain Relieving Factors  nothing helps much; rest    Effect of Pain on Daily Activities  waking limited         [facility] PT Assessment - 05/17/19 0001      Assessment   Medical Diagnosis  right groin pain    Referring Provider (PT)  Eunice Blase, MD    Onset Date/Surgical Date  04/03/18    Next MD Visit  none      Precautions   Precautions  None      Restrictions   Weight Bearing Restrictions  No      Balance Screen   Has the patient fallen in the past 6 months  No    Has the patient had a decrease in activity level because of a fear of falling?   No    Is the patient reluctant to leave their home because of a fear of falling?   No  [place] residence    Additional Comments  two story home       Prior Function   Level of [place]  Retired    Leisure  working out, walking      Observation/Other Assessments   Focus on Therapeutic Outcomes (FOTO)   37% limited (goal 32%)      Posture/Postural Control   Posture Comments  right foot pronation       ROM / Strength   AROM / PROM / Strength  AROM;Strength;PROM      AROM   Overall AROM Comments  Lumbar WNL all planes; bil hips/knees WNL      PROM   Overall PROM Comments  pain with passive right hip IR in 90/90      Strength   Overall Strength Comments  bil hip flex 4+/5 with right pain, else 5/5 in BLE; no pain with resisted ADDuction or IR on right      Flexibility   Soft Tissue Assessment /Muscle Length  yes    Hamstrings  bil tightness    Quadriceps  mild tightness    ITB  WNL    Piriformis  WNL    Quadratus Lumborum  Right ADDuctors tight      Palpation   Palpation comment  some tenderness of Right iliopsoas; right gluteals      Special Tests     Special Tests  Hip Special Tests    Hip Special Tests   Saralyn Pilar (FABER) Test;Ober's Test;Hip Scouring;Anterior Hip Impingement Test      Saralyn Pilar [facility]) Test   Findings  Positive    Side  Right      Ober's Test   Findings  Negative    Side  Right      Hip Scouring   Findings  Positive    Side  Right      Anterior Hip Impingement Test    Findings  Positive    Side   Right                Objective measurements completed on examination: See above findings.      [place] Adult PT Treatment/Exercise - 05/17/19 0001      Manual Therapy   Manual Therapy  Joint mobilization;Manual Traction    Joint Mobilization  hip distraction with belt     Manual Traction  long leg traction right             PT Education - 05/17/19 1548    Education Details  HEP; DN H/O    Person(s) Educated  Patient    Methods  Explanation;Demonstration;Handout    Comprehension  Verbalized understanding;Returned demonstration       PT Short Term Goals - 05/17/19 1601      PT SHORT TERM GOAL #1   Title  Ind with initial HEP    Time  2    Period  Weeks    Status  New    Target Date  05/31/19      PT SHORT TERM GOAL #2   Title  Patient to report decreased pain in right hip with walking by 25%    Time  3    Period  Weeks    Status  New    Target Date  06/07/19        PT Long Term Goals - 05/17/19 1602      PT LONG TERM GOAL #1  Title  Ind with advanced HEP for strength and flexibilty    Time  6    Period  Weeks    Status  New    Target Date  06/28/19      PT LONG TERM GOAL #2   Title  Patient able to walk without limitation from groin pain    Time  6    Period  Weeks    Status  New      PT LONG TERM GOAL #3   Title  Patient to report no pain > than 1/10 in the right groin with ADLS including getting in/out of car.    Time  6    Period  Weeks    Status  New      PT LONG TERM GOAL #4   Title  decreased FOTO limitations to <= 32%    Time  6    Period  Weeks     Status  New             Plan - 05/17/19 1549    Clinical Impression Statement  Patient presents with complaints of right groin pain beginning in March 2020 and worsening in the past few months. She has pain mainly with walking and hip ER/ABD, such as getting out of the car. Patient has pain with FABER, FADIR and hip scour into the groin, but no pain with active IR and adduction. Patient did not get relief with hip distraction or long leg traction today. She felt a good stretch in the right adductors in standing, but is not tender to palpation here nor does she have pain with MMT to ADDuctors. She has mild strength deficits in bil hip flexors and also TPs in the right gluteals. Patient will benefit from PT to decrease pain so that pt can walk painfree.    Examination-Activity Limitations  Locomotion Level    Stability/Clinical Decision Making  Evolving/Moderate complexity    Clinical Decision Making  Low    Rehab Potential  Excellent    PT Frequency  2x / week    PT Duration  6 weeks    PT Treatment/Interventions  ADLs/Self Care Home Management;Cryotherapy;Electrical Stimulation;Iontophoresis 4mg /ml Dexamethasone;Moist Heat;Neuromuscular re-education;Therapeutic exercise;Therapeutic activities;Patient/family education;Manual techniques;Dry needling;Joint Manipulations;Spinal Manipulations    PT Next Visit Plan  review standing hip ADDuctor stretch; manual/DN to right hip ADDuctors/gluteals; hip mobs/distraction; check hip flexor tightness    PT Home Exercise Plan  [email or web address]    Consulted and Agree with Plan of Care  Patient       Patient will benefit from skilled therapeutic intervention in order to improve the following deficits and impairments:  Pain, Decreased strength, Impaired flexibility  Visit Diagnosis: Right groin pain - Plan: PT plan of care cert/re-cert     Problem List Patient Active Problem List   Diagnosis Date Noted  . Fatty infiltration of liver 11/02/2017  . Primary  osteoarthritis of right knee 08/13/2017  . GERD (gastroesophageal reflux disease) 07/04/2015  . Anxiety 07/04/2015  . Hyperlipidemia 07/04/2015  . Tobacco use 07/04/2015    Madelyn Flavors PT 05/17/2019, 4:07 PM  [facility] [address], [place], [place], [address] Phone: [phone]   Fax:  [phone]  Name: Reyes Roskelley MRN: [id number] Date of Birth: 1955/03/17

## 2019-05-17 NOTE — Patient Instructions (Signed)
Access Code: [email or web address] URL: [email or web address] Date: 05/17/2019 Prepared by: Almyra Free Eddison Searls  Exercises Side Lunge Adductor Stretch - 2 x daily - 7 x weekly - 1 sets - 3 reps - 10-30 sec hold  Patient Education Trigger Point Dry Needling

## 2019-05-22 ENCOUNTER — Other Ambulatory Visit: Payer: Self-pay

## 2019-05-22 ENCOUNTER — Ambulatory Visit: Payer: Medicare Other

## 2019-05-22 DIAGNOSIS — R1031 Right lower quadrant pain: Secondary | ICD-10-CM

## 2019-05-22 NOTE — Therapy (Signed)
[facility] [address] [place], [place], [address] Phone: [phone]   Fax:  [phone]  Physical Therapy Treatment  Patient Details  Name: Lynn Bautista MRN: [id number] Date of Birth: [birth year] Referring Provider (PT): Eunice Blase, MD   Encounter Date: 05/22/2019  PT End of Session - 05/22/19 1618    Visit Number  2    Date for PT Re-Evaluation  06/28/19    Authorization Type  Medicare    PT Start Time  1531   dry needling   PT Stop Time  [id number]    PT Time Calculation (min)  37 min       Past Medical History:  Diagnosis Date  . Anxiety   . GERD (gastroesophageal reflux disease)     Past Surgical History:  Procedure Laterality Date  . ABDOMINAL HYSTERECTOMY    . APPENDECTOMY    . BREAST DUCTAL SYSTEM EXCISION Right 01/02/2015   Procedure: RIGHT BREAST CENTRAL DUCT EXCISION;  Surgeon: Erroll Luna, MD;  Location: [place];  Service: General;  Laterality: Right;  . BREAST EXCISIONAL BIOPSY Right    benign  . BUNIONECTOMY Bilateral   . CHOLECYSTECTOMY    . TONSILLECTOMY      There were no vitals filed for this visit.  Subjective Assessment - 05/22/19 1532    Subjective  I have been doing my exercises.  Yesterday was a bad day due to a lot of walking.    Patient Stated Goals  to get rid of the pain and be able to walk and lose weight    Currently in Pain?  Yes    Pain Score  5    it is just constant with movement.   Pain Location  Groin    Pain Orientation  Right    Pain Descriptors / Indicators  --   pulling   Pain Type  Chronic pain    Pain Onset  More than a month ago    Pain Frequency  Constant    Aggravating Factors   walking, moving leg out of car    Pain Relieving Factors  nothing helps                       [facility] Adult PT Treatment/Exercise - 05/22/19 0001      Exercises   Exercises  Knee/Hip      Knee/Hip Exercises: Stretches   Hip Flexor  Stretch  Right;3 reps;20 seconds      Knee/Hip Exercises: Standing   Other Standing Knee Exercises  hip adductor stretch 3x20 seconds      Knee/Hip Exercises: Supine   Other Supine Knee/Hip Exercises  butterfly stretch 3x20 seconds      Manual Therapy   Manual Therapy  Soft tissue mobilization;Myofascial release    Manual therapy comments  Rt hip adductors and quads       Trigger Point Dry Needling - 05/22/19 0001    Consent Given?  Yes    Education Handout Provided  Previously provided    Muscles Treated Lower Quadrant  Adductor longus/brevis/magnus;Quadriceps   Rt only   Quadriceps Response  Twitch response elicited;Palpable increased muscle length    Adductor Response  Twitch response elicited;Palpable increased muscle length           PT Education - 05/22/19 1603    Education Details  Access Code: [email or web address]    Person(s) Educated  Patient    Methods  Explanation;Demonstration;Handout  Comprehension  Verbalized understanding;Returned demonstration       PT Short Term Goals - 05/17/19 1601      PT SHORT TERM GOAL #1   Title  Ind with initial HEP    Time  2    Period  Weeks    Status  New    Target Date  05/31/19      PT SHORT TERM GOAL #2   Title  Patient to report decreased pain in right hip with walking by 25%    Time  3    Period  Weeks    Status  New    Target Date  06/07/19        PT Long Term Goals - 05/17/19 1602      PT LONG TERM GOAL #1   Title  Ind with advanced HEP for strength and flexibilty    Time  6    Period  Weeks    Status  New    Target Date  06/28/19      PT LONG TERM GOAL #2   Title  Patient able to walk without limitation from groin pain    Time  6    Period  Weeks    Status  New      PT LONG TERM GOAL #3   Title  Patient to report no pain > than 1/10 in the right groin with ADLS including getting in/out of car.    Time  6    Period  Weeks    Status  New      PT LONG TERM GOAL #4   Title  decreased FOTO limitations  to <= 32%    Time  6    Period  Weeks    Status  New            Plan - 05/22/19 1617    Clinical Impression Statement  Pt with first time follow-up after evaluation today.  Session focused on review of HEP for hip adductor flexibility and addition of hip flexor and adductor stretches in addition to dry needling and manual to address trigger points.  Pt with good response to dry needling to Rt adductors and demonstrated improved flexibility and improved tissue mobility after manual therapy and dry needling.  Pt will continue to benefit from skilled PT to address Rt hip pain.    PT Frequency  2x / week    PT Duration  6 weeks    PT Treatment/Interventions  ADLs/Self Care Home Management;Cryotherapy;Electrical Stimulation;Iontophoresis 4mg /ml Dexamethasone;Moist Heat;Neuromuscular re-education;Therapeutic exercise;Therapeutic activities;Patient/family education;Manual techniques;Dry needling;Joint Manipulations;Spinal Manipulations    PT Next Visit Plan  review new stretches, continue dry needling if helpful, hip mobs/distraction, add [place]    Recommended Other Services  cert is signed    Consulted and Agree with Plan of Care  Patient       Patient will benefit from skilled therapeutic intervention in order to improve the following deficits and impairments:     Visit Diagnosis: Right groin pain     Problem List Patient Active Problem List   Diagnosis Date Noted  . Fatty infiltration of liver 11/02/2017  . Primary osteoarthritis of right knee 08/13/2017  . GERD (gastroesophageal reflux disease) 07/04/2015  . Anxiety 07/04/2015  . Hyperlipidemia 07/04/2015  . Tobacco use 07/04/2015    Sigurd Sos, PT 05/22/19 4:21 PM  [facility] [address] [place], [place], [address] Phone:  [phone]   Fax:  [phone]  Name: Lynn Bautista MRN: [id number] Date of Birth:  [birth year]

## 2019-05-22 NOTE — Patient Instructions (Signed)
Access Code: [email or web address] URL: [email or web address] Date: 05/22/2019 Prepared by: Claiborne Billings  Exercises  Modified Marcello Moores Stretch - 2 x daily - 7 x weekly - 1 sets - 3 reps - 20 hold Supine Butterfly Groin Stretch - 2 x daily - 7 x weekly - 1 sets - 1 reps - 20 hold  Patient Education Trigger Point Dry Needling

## 2019-06-07 ENCOUNTER — Ambulatory Visit: Payer: Medicare Other | Attending: Family Medicine

## 2019-06-07 ENCOUNTER — Other Ambulatory Visit: Payer: Self-pay

## 2019-06-07 DIAGNOSIS — M545 Low back pain: Secondary | ICD-10-CM | POA: Insufficient documentation

## 2019-06-07 DIAGNOSIS — R1031 Right lower quadrant pain: Secondary | ICD-10-CM | POA: Diagnosis not present

## 2019-06-07 NOTE — Therapy (Addendum)
[facility] [address], [place], [place], [address] Phone: [phone]   Fax:  [phone]  Physical Therapy Treatment  Patient Details  Name: Lynn Bautista MRN: [id number] Date of Birth: 12-21-1955 Referring Provider (PT): Eunice Blase, MD   Encounter Date: 06/07/2019  PT End of Session - 06/07/19 1252    Visit Number  3    Date for PT Re-Evaluation  06/28/19    Authorization Type  Medicare    PT Start Time  1229    PT Stop Time  1310    PT Time Calculation (min)  41 min    Activity Tolerance  Patient tolerated treatment well    Behavior During Therapy  [facility] for tasks assessed/performed       Past Medical History:  Diagnosis Date  . Anxiety   . GERD (gastroesophageal reflux disease)     Past Surgical History:  Procedure Laterality Date  . ABDOMINAL HYSTERECTOMY    . APPENDECTOMY    . BREAST DUCTAL SYSTEM EXCISION Right 01/02/2015   Procedure: RIGHT BREAST CENTRAL DUCT EXCISION;  Surgeon: Erroll Luna, MD;  Location: [place];  Service: General;  Laterality: Right;  . BREAST EXCISIONAL BIOPSY Right    benign  . BUNIONECTOMY Bilateral   . CHOLECYSTECTOMY    . TONSILLECTOMY      There were no vitals filed for this visit.  Subjective Assessment - 06/07/19 1227    Subjective  I feel better after needling. I can tell a difference.  I feel 50% better.    Currently in Pain?  No/denies    Pain Score  --   up to a 3/10 with movement   Pain Location  Groin    Pain Orientation  Right                       [facility] Adult PT Treatment/Exercise - 06/07/19 0001      Knee/Hip Exercises: Aerobic   Nustep  Level 1 x 6 minutes   PT present to discuss progress     Knee/Hip Exercises: Standing   Other Standing Knee Exercises  hip adductor stretch 3x20 seconds      Knee/Hip Exercises: Supine   Bridges  Strengthening;Both;2 sets;10 reps      Knee/Hip Exercises: Sidelying    Clams  2x10 bil      Manual Therapy   Manual Therapy  Soft tissue mobilization;Myofascial release    Manual therapy comments  Rt hip adductors and quads       Trigger Point Dry Needling - 06/07/19 0001    Consent Given?  Yes    Education Handout Provided  Previously provided    Muscles Treated Lower Quadrant  Adductor longus/brevis/magnus;Quadriceps   Rt only   Quadriceps Response  Twitch response elicited;Palpable increased muscle length    Adductor Response  Twitch response elicited;Palpable increased muscle length           PT Education - 06/07/19 1249    Education Details  Access Code: [email or web address]    Person(s) Educated  Patient    Methods  Explanation;Demonstration;Handout    Comprehension  Verbalized understanding;Returned demonstration       PT Short Term Goals - 06/07/19 1238      PT SHORT TERM GOAL #1   Title  Ind with initial HEP    Status  Achieved      PT SHORT TERM GOAL #2   Title  Patient to report decreased  pain in right hip with walking by 25%    Baseline  50%    Status  Achieved        PT Long Term Goals - 06/07/19 1238      PT LONG TERM GOAL #1   Title  Ind with advanced HEP for strength and flexibilty    Time  6    Period  Weeks    Status  On-going      PT LONG TERM GOAL #2   Title  Patient able to walk without limitation from groin pain    Baseline  no pain with regular walking.  Haven't tried walking for exercise    Time  6    Period  Weeks    Status  On-going      PT LONG TERM GOAL #3   Title  Patient to report no pain > than 1/10 in the right groin with ADLS including getting in/out of car.    Baseline  up to 3/10    Time  6    Period  Weeks    Status  On-going            Plan - 06/07/19 1242    Clinical Impression Statement  Pt had a lapse in treatment since 05/22/19.  Pt reports 50% overall improvement in symptoms since dry needling last session. Pt reports up to 3/10 Rt groin pain with getting in/out of the car.  Pt denies  any pain with walking and has not tried walking for exercise.  Pt is independent and compliant in HEP for flexibility and PT added strength to HEP today.  Pt with tension and trigger point in the Rt quad and groin that are significantly improved since last session.  Pt demonstrated improved tissue mobility after manual and dry needling today.  Pt will continue to benefit from skilled PT to address Rt hip flexibility, strength and mobility.    PT Frequency  2x / week    PT Duration  6 weeks    PT Treatment/Interventions  ADLs/Self Care Home Management;Cryotherapy;Electrical Stimulation;Iontophoresis 90m/ml Dexamethasone;Moist Heat;Neuromuscular re-education;Therapeutic exercise;Therapeutic activities;Patient/family education;Manual techniques;Dry needling;Joint Manipulations;Spinal Manipulations    PT Next Visit Plan  review new strength exercises, continue dry needling if helpful, hip mobs/distraction    PT Home Exercise Plan  [email or web address]    Consulted and Agree with Plan of Care  Patient       Patient will benefit from skilled therapeutic intervention in order to improve the following deficits and impairments:  Pain, Decreased strength, Impaired flexibility  Visit Diagnosis: Right groin pain     Problem List Patient Active Problem List   Diagnosis Date Noted  . Fatty infiltration of liver 11/02/2017  . Primary osteoarthritis of right knee 08/13/2017  . GERD (gastroesophageal reflux disease) 07/04/2015  . Anxiety 07/04/2015  . Hyperlipidemia 07/04/2015  . Tobacco use 07/04/2015    KSigurd Sos PT 06/07/19 1:12 PM PHYSICAL THERAPY DISCHARGE SUMMARY  Visits from Start of Care: 3  Current functional level related to goals / functional outcomes: See above for most current status.  Pt didn't return after session on 06/07/19   Remaining deficits: See above for most current status.     Education / Equipment: HEP Plan: Patient agrees to discharge.  Patient goals were not met. Patient  is being discharged due to not returning since the last visit.  ?????        KSigurd Sos PT 07/25/19 7:21 AM  [facility] [address] [place], [place], [address] Phone: [phone]   Fax:  [phone]  Name: Lynn Bautista MRN: [id number] Date of Birth: 1955/02/08

## 2019-06-07 NOTE — Patient Instructions (Signed)
Access Code: [email or web address] URL: [email or web address] Date: 05/22/2019 Prepared by: Hassel Neth - 2 x daily - 7 x weekly - 2 sets - 10 reps - 5 hold Clamshell - 2 x daily - 7 x weekly - 2 sets - 10 reps

## 2019-06-13 ENCOUNTER — Encounter: Payer: Medicare Other | Admitting: Physical Therapy

## 2019-07-20 ENCOUNTER — Other Ambulatory Visit (INDEPENDENT_AMBULATORY_CARE_PROVIDER_SITE_OTHER): Payer: Medicare Other

## 2019-07-20 ENCOUNTER — Other Ambulatory Visit: Payer: Self-pay

## 2019-07-20 DIAGNOSIS — E785 Hyperlipidemia, unspecified: Secondary | ICD-10-CM

## 2019-07-20 LAB — COMPREHENSIVE METABOLIC PANEL
ALT: 24 U/L (ref 0–35)
AST: 15 U/L (ref 0–37)
Albumin: 4.2 g/dL (ref 3.5–5.2)
Alkaline Phosphatase: 89 U/L (ref 39–117)
BUN: 10 mg/dL (ref 6–23)
CO2: 29 mEq/L (ref 19–32)
Calcium: 9.3 mg/dL (ref 8.4–10.5)
Chloride: 105 mEq/L (ref 96–112)
Creatinine, Ser: 0.82 mg/dL (ref 0.40–1.20)
GFR: 69.7 mL/min (ref >60.00)
Glucose, Bld: 101 mg/dL — ABNORMAL HIGH (ref 70–99)
Potassium: 4.1 mEq/L (ref 3.5–5.1)
Sodium: 140 mEq/L (ref 135–145)
Total Bilirubin: 0.5 mg/dL (ref 0.2–1.2)
Total Protein: 6.5 g/dL (ref 6.0–8.3)

## 2019-07-20 LAB — LIPID PANEL
Cholesterol: 165 mg/dL (ref 0–200)
HDL: 46.1 mg/dL (ref >39.00)
LDL Cholesterol: 96 mg/dL (ref 0–99)
NonHDL: 119.16
Total CHOL/HDL Ratio: 4
Triglycerides: 115 mg/dL (ref 0.0–149.0)
VLDL: 23 mg/dL (ref 0.0–40.0)

## 2019-07-25 ENCOUNTER — Other Ambulatory Visit: Payer: Self-pay

## 2019-07-26 ENCOUNTER — Ambulatory Visit (INDEPENDENT_AMBULATORY_CARE_PROVIDER_SITE_OTHER): Payer: Medicare Other | Admitting: Family Medicine

## 2019-07-26 ENCOUNTER — Encounter: Payer: Self-pay | Admitting: Family Medicine

## 2019-07-26 VITALS — BP 118/76 | HR 60 | Temp 97.3°F | Resp 12 | Ht 61.0 in | Wt 158.2 lb

## 2019-07-26 DIAGNOSIS — K760 Fatty (change of) liver, not elsewhere classified: Secondary | ICD-10-CM | POA: Diagnosis not present

## 2019-07-26 DIAGNOSIS — E785 Hyperlipidemia, unspecified: Secondary | ICD-10-CM

## 2019-07-26 DIAGNOSIS — R079 Chest pain, unspecified: Secondary | ICD-10-CM | POA: Diagnosis not present

## 2019-07-26 DIAGNOSIS — F419 Anxiety disorder, unspecified: Secondary | ICD-10-CM

## 2019-07-26 NOTE — Progress Notes (Signed)
HPI:  Lynn Bautista is a 64 y.o. female, who is here today for 6 months follow up.  She had blood work recently.  She was last seen on 04/17/19. She is on Atorvastatin 40 mg daily. She has tolerated medication well.  Last week she had some achy like right-sided chest pain,"no bad." Pain was not radiated, lasted several hours. It was not related with exertion. No associated SOB,dizziness,or diaphoresis.  Hx of GERD, she has not had heartburn. She is not longer on Omeprazole.  Lab Results  Component Value Date   CHOL 165 07/20/2019   HDL 46.10 07/20/2019   LDLCALC 96 07/20/2019   TRIG 115.0 07/20/2019   CHOLHDL 4 07/20/2019   Hx of fatty liver and abnormal LFT's.  Negative for high alcohol intake.  In 12/2013 she had abdominal CT that showed course calcifications and capsular liver hypodensities. Slightly associated capsular retraction in these area. Because she has had liver lesions for about 9 years (at the time CT was done) the likelihood of malignancy (cholangiocarcinoma) was "very low." Liver Bx reported as negative.  Evaluated by GI on 01/09/2014.  S/P cholecystectomy.  She has tried to eat healthier, decreased carb intake.  Lab Results  Component Value Date   ALT 24 07/20/2019   AST 15 07/20/2019   ALKPHOS 89 07/20/2019   BILITOT 0.5 07/20/2019   She has been under some stress , she sold her house and still  looking for a place to move in [place]. In general she is dealing well with stress. She takes Effexor XR 75 mg daily, she has taken sine 2004.  Review of Systems  Constitutional: Negative for activity change, appetite change, fatigue and fever.  HENT: Negative for mouth sores, nosebleeds and sore throat.   Eyes: Negative for redness and visual disturbance.  Respiratory: Negative for cough and wheezing.   Cardiovascular: Negative for palpitations and leg swelling.  Gastrointestinal: Negative for abdominal pain, nausea and vomiting.        Negative for changes in bowel habits.  Genitourinary: Negative for decreased urine volume and hematuria.  Neurological: Negative for syncope, weakness and headaches.  Rest of ROS, see pertinent positives sand negatives in HPI  Current Outpatient Medications on File Prior to Visit  Medication Sig Dispense Refill  . atorvastatin (LIPITOR) 40 MG tablet Take 1 tablet (40 mg total) by mouth daily. 90 tablet 3  . diclofenac Sodium (VOLTAREN) 1 % GEL Apply 4 g topically 4 (four) times daily as needed. 500 g 6  . venlafaxine XR (EFFEXOR-XR) 75 MG 24 hr capsule TAKE 1 CAPSULE BY MOUTH EVERY DAY WITH BREAKFAST 90 capsule 3  . omeprazole (PRILOSEC) 20 MG capsule Take 1 capsule (20 mg total) by mouth daily as needed. 90 capsule 2   No current facility-administered medications on file prior to visit.   Past Medical History:  Diagnosis Date  . Anxiety   . GERD (gastroesophageal reflux disease)    No Known Allergies  Social History   Socioeconomic History  . Marital status: Divorced    Spouse name: Not on file  . Number of children: 2  . Years of education: Not on file  . Highest education level: Not on file  Occupational History  . Occupation: Facilities manager: [employer]  Tobacco Use  . Smoking status: Current Every Day Smoker    Packs/day: 0.50    Start date: 04/27/1968  . Smokeless tobacco: Never Used  Substance and Sexual  Activity  . Alcohol use: Yes    Alcohol/week: 8.0 standard drinks    Types: 8 Standard drinks or equivalent per week    Comment: social  . Drug use: No  . Sexual activity: Not on file  Other Topics Concern  . Not on file  Social History Narrative  . Not on file   Social Determinants of Health   Financial Resource Strain:   . Difficulty of Paying Living Expenses:   Food Insecurity:   . Worried About Charity fundraiser in the Last Year:   . Arboriculturist in the Last Year:   Transportation Needs:   . Film/video editor (Medical):    Marland Kitchen Lack of Transportation (Non-Medical):   Physical Activity:   . Days of Exercise per Week:   . Minutes of Exercise per Session:   Stress:   . Feeling of Stress :   Social Connections:   . Frequency of Communication with Friends and Family:   . Frequency of Social Gatherings with Friends and Family:   . Attends Religious Services:   . Active Member of Clubs or Organizations:   . Attends Archivist Meetings:   Marland Kitchen Marital Status:     Vitals:   07/26/19 0901  BP: 118/76  Pulse: 60  Resp: 12  Temp: (!) 97.3 F (36.3 C)  SpO2: 97%   Wt Readings from Last 3 Encounters:  07/26/19 158 lb 4 oz (71.8 kg)  04/17/19 157 lb (71.2 kg)  06/24/18 162 lb 4 oz (73.6 kg)    Body mass index is 29.9 kg/m.  Physical Exam  Nursing note and vitals reviewed. Constitutional: She is oriented to person, place, and time. She appears well-developed. No distress.  HENT:  Head: Normocephalic and atraumatic.  Eyes: Pupils are equal, round, and reactive to light. Conjunctivae are normal.  Cardiovascular: Normal rate and regular rhythm.  No murmur heard. Pulses:      Dorsalis pedis pulses are 2+ on the right side and 2+ on the left side.  Respiratory: Effort normal and breath sounds normal. No respiratory distress.  GI: Soft. She exhibits no mass. There is no hepatomegaly. There is no abdominal tenderness.  Lymphadenopathy:    She has no cervical adenopathy.  Neurological: She is alert and oriented to person, place, and time. No cranial nerve deficit. Gait normal.  Skin: Skin is warm. No rash noted. No erythema.  Psychiatric:  Well groomed, good eye contact.   ASSESSMENT AND PLAN:  [name] was seen today for 6 months follow-up.  Diagnoses and all orders for this visit:  Fatty infiltration of liver LFT's has been normal. Low fat diet and wt loss will help. Avoid high alcohol intake. Continue Atorvastatin.  Last wt here in the office on 06/24/18 162 Lb 4  oz. Encouraged to continue a healthful diet and to engage in regular physical activity consistently.   Hyperlipidemia, unspecified hyperlipidemia type Great improvement. Continue Atorvastatin 40 mg daily and low fat diet.  Anxiety disorder, unspecified type Otherwise stable. Dealing well with stress. Continue Effexor XR 70 mg daily.  Right-sided chest pain Resolved. Hx and examination do not suggest a serious process. ? GERD, musculoskeletal. For now no further tests recommended. Instructed about warning signs.  No follow-ups on file.  Aarron Wierzbicki G. [place], MD  [facility]. [place] office.   A few things to remember from today's visit:  No changes today. I will see you back in 10/2019 if you have  not move.  If you need refills please call your pharmacy. Do not use My Chart to request refills or for acute issues that need immediate attention.    Please be sure medication list is accurate. If a new problem present, please set up appointment sooner than planned today.

## 2019-07-26 NOTE — Patient Instructions (Signed)
A few things to remember from today's visit:  No changes today. I will see you back in 10/2019 if you have not move.  If you need refills please call your pharmacy. Do not use My Chart to request refills or for acute issues that need immediate attention.    Please be sure medication list is accurate. If a new problem present, please set up appointment sooner than planned today.

## 2019-07-30 ENCOUNTER — Encounter: Payer: Self-pay | Admitting: Family Medicine

## 2019-09-10 IMAGING — MG DIGITAL DIAGNOSTIC UNILATERAL LEFT MAMMOGRAM
3 series · 3 of 3 positions shown · non-contrast
Comparison: Previous exam(s).

CLINICAL DATA: Patient was called back from screening mammogram for
possible calcifications in the left breast.

EXAM:
DIGITAL DIAGNOSTIC LEFT MAMMOGRAM WITH CAD

[L ML]
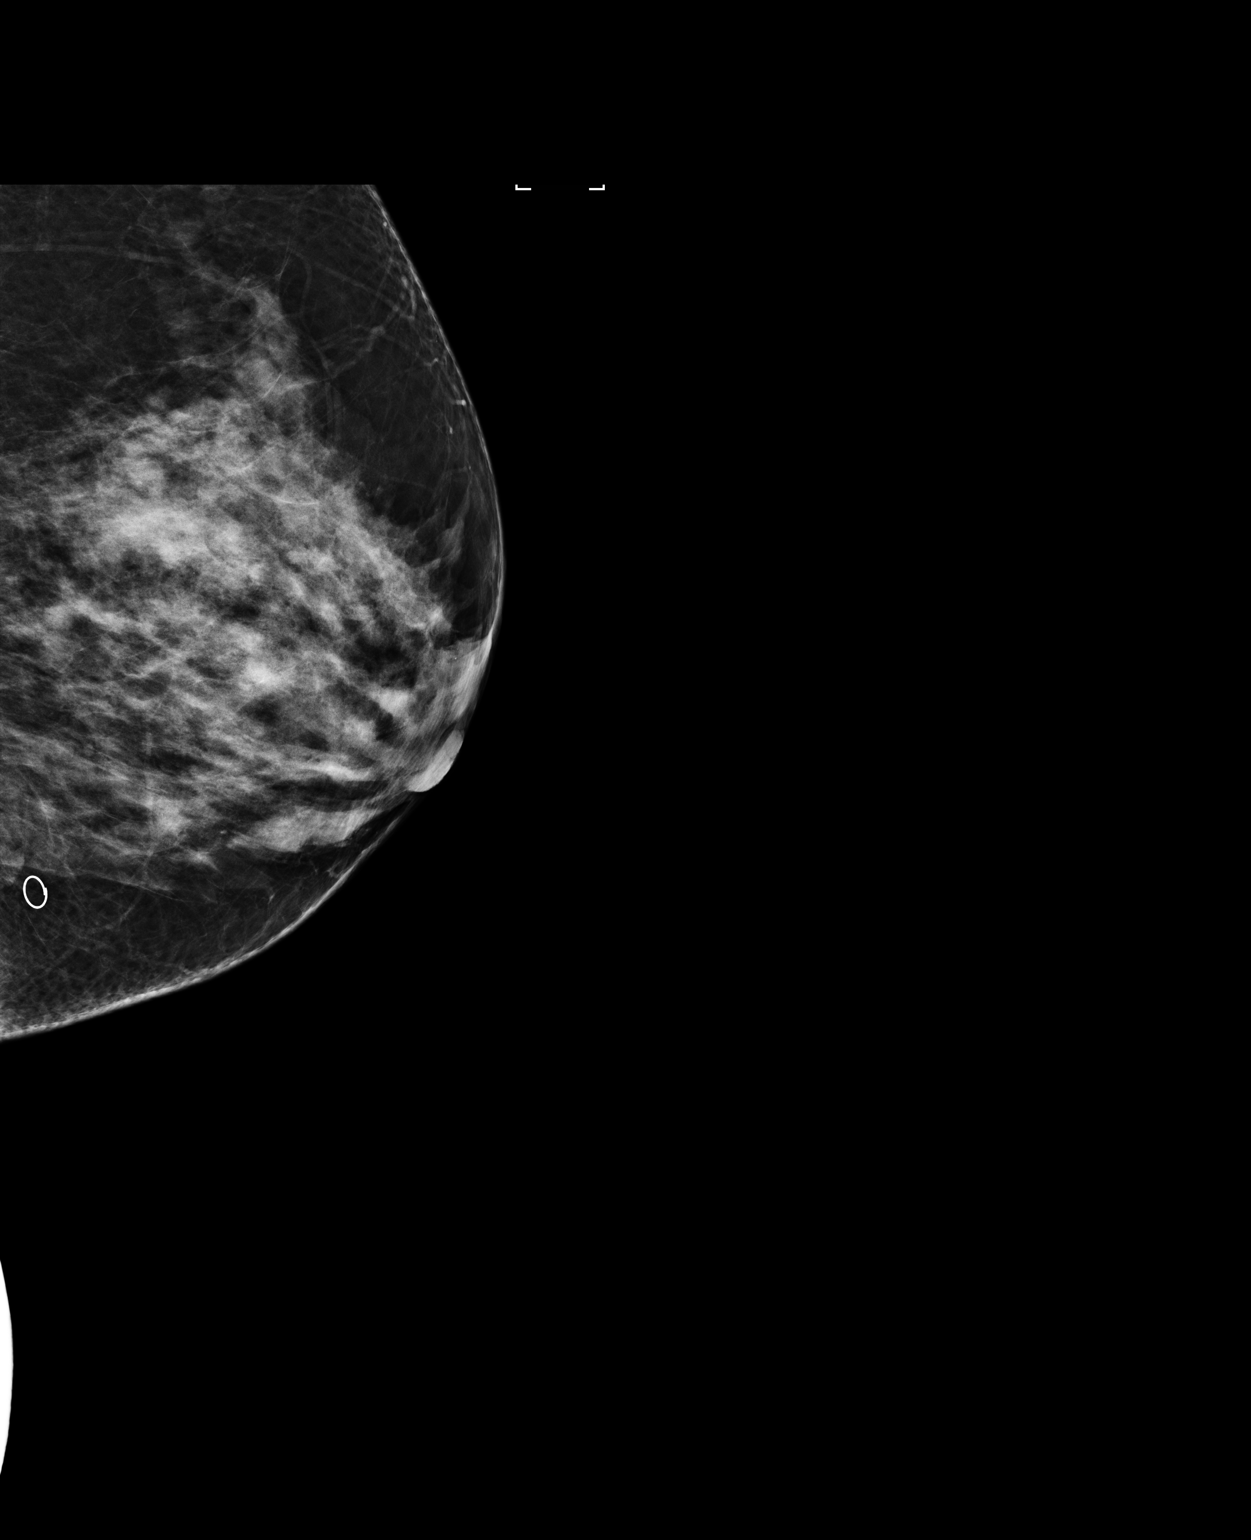

[L CC (1 of 2)]
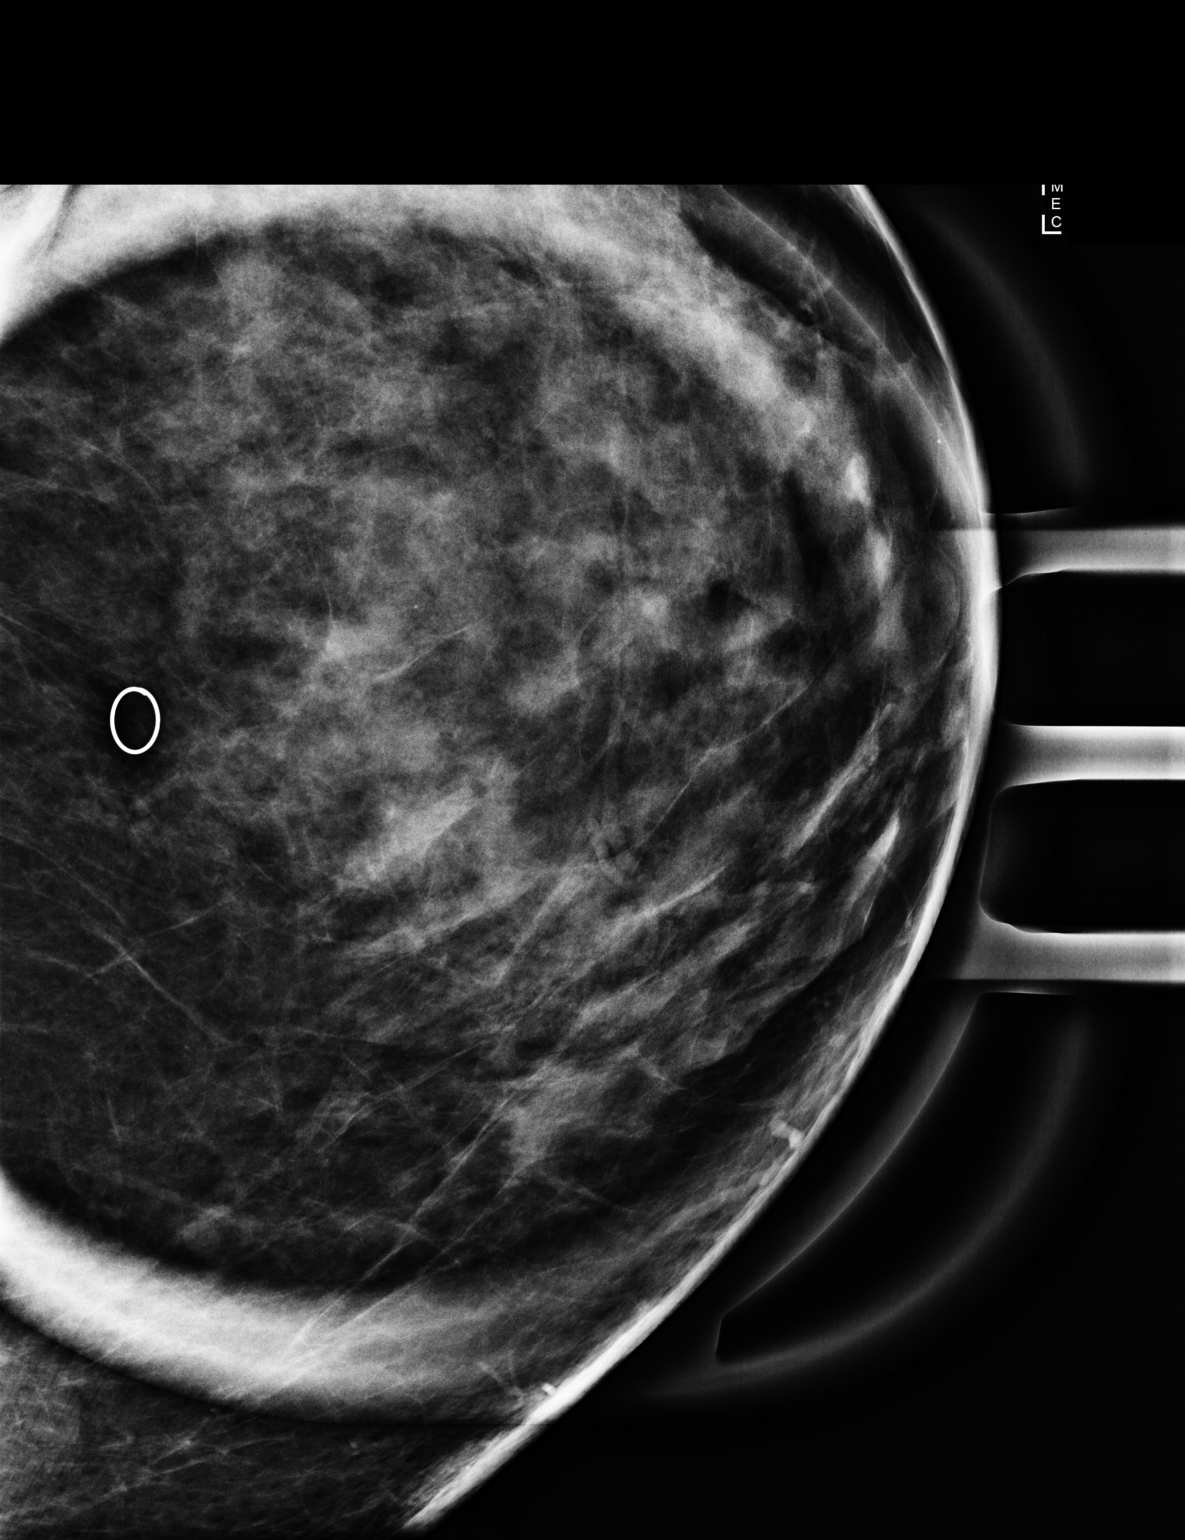

[L CC (2 of 2)]
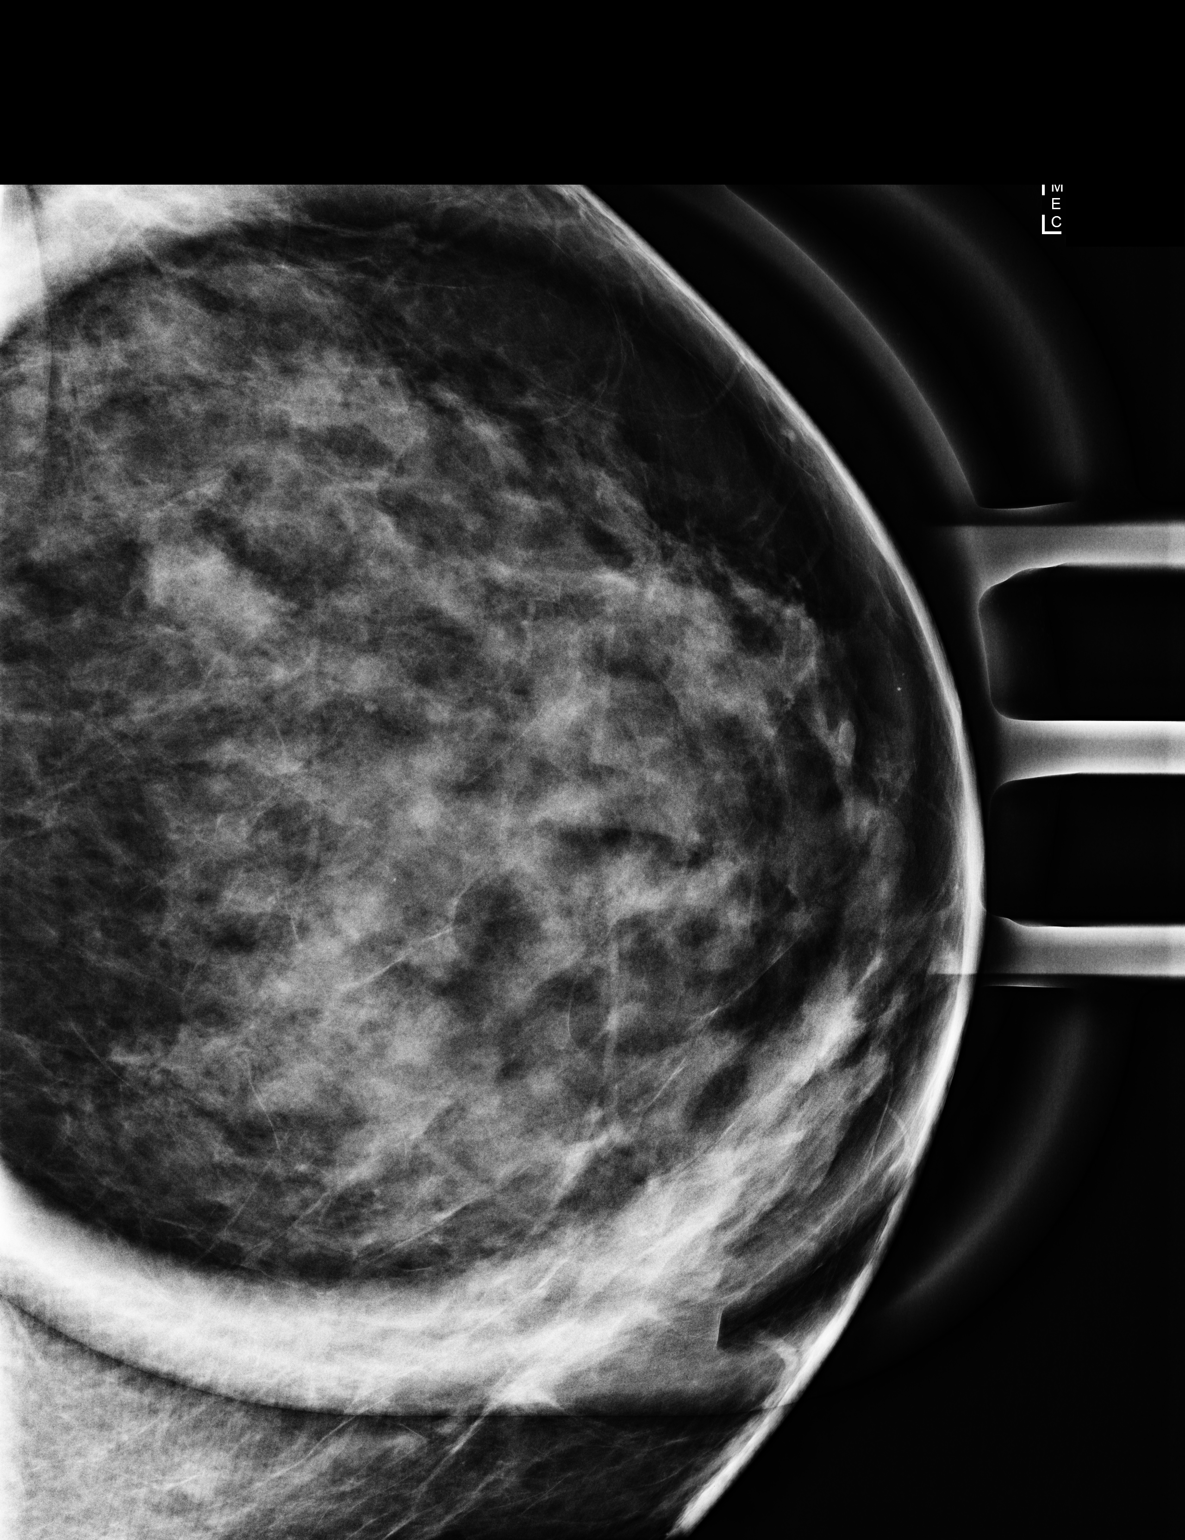

[3 of 3 positions shown; findings below may reference images not displayed]

ACR Breast Density Category c: The breast tissue is heterogeneously
dense, which may obscure small masses.
FINDINGS: Additional imaging of the left breast was performed. No suspicious
calcifications, mass or distortion identified.

Mammographic images were processed with CAD.
IMPRESSION: No evidence of malignancy in the left breast.

RECOMMENDATION:
Bilateral screening mammogram in 1 year is recommended.

I have discussed the findings and recommendations with the patient.
Results were also provided in writing at the conclusion of the
visit. If applicable, a reminder letter will be sent to the patient
regarding the next appointment.

BI-RADS CATEGORY  1: Negative.

## 2019-10-16 ENCOUNTER — Other Ambulatory Visit: Payer: Self-pay

## 2019-10-17 ENCOUNTER — Ambulatory Visit (INDEPENDENT_AMBULATORY_CARE_PROVIDER_SITE_OTHER): Payer: Medicare Other | Admitting: Family Medicine

## 2019-10-17 ENCOUNTER — Encounter: Payer: Self-pay | Admitting: Family Medicine

## 2019-10-17 VITALS — BP 128/80 | HR 82 | Temp 98.0°F | Resp 16 | Ht 61.0 in | Wt 158.0 lb

## 2019-10-17 DIAGNOSIS — K219 Gastro-esophageal reflux disease without esophagitis: Secondary | ICD-10-CM

## 2019-10-17 DIAGNOSIS — E785 Hyperlipidemia, unspecified: Secondary | ICD-10-CM

## 2019-10-17 DIAGNOSIS — Z780 Asymptomatic menopausal state: Secondary | ICD-10-CM

## 2019-10-17 DIAGNOSIS — F419 Anxiety disorder, unspecified: Secondary | ICD-10-CM

## 2019-10-17 DIAGNOSIS — Z0000 Encounter for general adult medical examination without abnormal findings: Secondary | ICD-10-CM

## 2019-10-17 NOTE — Patient Instructions (Addendum)
  Ms. Elbe , Thank you for taking time to come for your Medicare Wellness Visit. I appreciate your ongoing commitment to your health goals. Please review the following plan we discussed and let me know if I can assist you in the future.   These are the goals we discussed: Goals    .  Acknowledge receipt of Advanced Directive package     Review form and consider completing and sign.    . Exercise 3x per week (30 min per time)     Walking 15 min daily at least 3 times per week.       This is a list of the screening recommended for you and due dates:  Health Maintenance  Topic Date Due  . Flu Shot  09/03/2019  . Pneumonia vaccines (1 of 2 - PCV13) 07/29/2020*  . Mammogram  10/26/2019  . Colon Cancer Screening  05/04/2023  . Tetanus Vaccine  02/22/2028  . DEXA scan (bone density measurement)  Completed  . COVID-19 Vaccine  Completed  .  Hepatitis C: One time screening is recommended by Center for Disease Control  (CDC) for  adults born from [birth year] through 1967.   Completed  *Topic was postponed. The date shown is not the original due date.   You need shingles vaccine,pneumonia shot,and flu shot. Mammogram to be schedule and I ordered bone density.  A few things to remember from today's visit:  Medicare annual wellness visit, initial  Asymptomatic postmenopausal estrogen deficiency - Plan: DG Bone Density  Anxiety   If you need refills please call your pharmacy. Do not use My Chart to request refills or for acute issues that need immediate attention.    Please be sure medication list is accurate. If a new problem present, please set up appointment sooner than planned today. A few tips:  -As we age balance is not as good as it was, so there is a higher risks for falls. Please remove small rugs and furniture that is "in your way" and could increase the risk of falls. Stretching exercises may help with fall prevention: Yoga and Tai Chi are some examples. Low impact exercise is  better, so you are not very achy the next day.  -Sun screen and avoidance of direct sun light recommended. Caution with dehydration, if working outdoors be sure to drink enough fluids.  - Some medications are not safe as we age, increases the risk of side effects and can potentially interact with other medication you are also taken;  including some of over the counter medications. Be sure to let me know when you start a new medication even if it is a dietary/vitamin supplement.   -Healthy diet low in red meet/animal fat and sugar + regular physical activity is recommended.

## 2019-10-17 NOTE — Progress Notes (Signed)
HPI: Lynn Bautista is a 64 y.o. female, who is here today for welcome to Medicare and follow-up.  She was last seen on 07/26/19. No new problems since her last visit.  She lives alone. Planning o moving to [place] next week, closing on her house this weekend.. Independent ADL's and IADL's.  No falls in the past year and denies depression symptoms.  Functional Status Survey: Is the patient deaf or have difficulty hearing?: No Does the patient have difficulty seeing, even when wearing glasses/contacts?: No Does the patient have difficulty concentrating, remembering, or making decisions?: No Does the patient have difficulty walking or climbing stairs?: No Does the patient have difficulty dressing or bathing?: No Does the patient have difficulty doing errands alone such as visiting a doctor's office or shopping?: No  Fall Risk  10/17/2019  Falls in the past year? 0  Number falls in past yr: 0  Injury with Fall? 0  Follow up Education provided   Providers she sees regularly: Eye care provider: Does not have a particular provider. Ortho: Dr Junius Roads.  Depression screen [facility] 2/9 10/17/2019  Decreased Interest 0  Down, Depressed, Hopeless 0  PHQ - 2 Score 0    Mini-Cog - 10/17/19 2131    Normal clock drawing test? yes    How many words correct? 3           Hearing Screening   _0  _1  _2  _3  _4  _5  _6  _7  _8   Right ear:           Left ear:             Visual Acuity Screening   Right eye Left eye Both eyes  Without correction:     With correction: _9   HLD: She is on Atorvastatin 40 mg daily.  Lab Results  Component Value Date   CHOL 165 07/20/2019   HDL 46.10 07/20/2019   LDLCALC 96 07/20/2019   TRIG 115.0 07/20/2019   CHOLHDL 4 07/20/2019   Anxiety: She is on Effexor XR 75 mg daily. She has been on same medication and dose intermittently sine 2004.  Medication still helping. Negative for depressed  mood.  GERD: Not longer on Omeprazole. Symptoms have improved with dietary changes.  Review of Systems  Constitutional: Negative for activity change, appetite change and fever.  Respiratory: Negative for cough, shortness of breath and wheezing.   Cardiovascular: Negative for chest pain, palpitations and leg swelling.  Gastrointestinal: Negative for abdominal pain, nausea and vomiting.       Negative for changes in bowel habits.  Genitourinary: Negative for decreased urine volume and hematuria.  Neurological: Negative for syncope and weakness.  Rest of ROS, see pertinent positives sand negatives in HPI  Current Outpatient Medications on File Prior to Visit  Medication Sig Dispense Refill  . atorvastatin (LIPITOR) 40 MG tablet Take 1 tablet (40 mg total) by mouth daily. 90 tablet 3  . diclofenac Sodium (VOLTAREN) 1 % GEL Apply 4 g topically 4 (four) times daily as needed. 500 g 6  . venlafaxine XR (EFFEXOR-XR) 75 MG 24 hr capsule TAKE 1 CAPSULE BY MOUTH EVERY DAY WITH BREAKFAST 90 capsule 3   No current facility-administered medications on file prior to visit.   Past Medical History:  Diagnosis Date  . Anxiety   . GERD (gastroesophageal reflux disease)   . Hyperlipidemia    No Known Allergies  Social History   Socioeconomic History  . Marital status: Divorced  Spouse name: Not on file  . Number of children: 2  . Years of education: Not on file  . Highest education level: Not on file  Occupational History  . Occupation: Facilities manager: [employer]  Tobacco Use  . Smoking status: Current Every Day Smoker    Packs/day: 0.50    Start date: 04/27/1968  . Smokeless tobacco: Never Used  Substance and Sexual Activity  . Alcohol use: Yes    Alcohol/week: 8.0 standard drinks    Types: 8 Standard drinks or equivalent per week    Comment: social  . Drug use: No  . Sexual activity: Not on file  Other Topics Concern  . Not on file  Social History Narrative   . Not on file   Social Determinants of Health   Financial Resource Strain:   . Difficulty of Paying Living Expenses: Not on file  Food Insecurity:   . Worried About Charity fundraiser in the Last Year: Not on file  . Ran Out of Food in the Last Year: Not on file  Transportation Needs:   . Lack of Transportation (Medical): Not on file  . Lack of Transportation (Non-Medical): Not on file  Physical Activity:   . Days of Exercise per Week: Not on file  . Minutes of Exercise per Session: Not on file  Stress:   . Feeling of Stress : Not on file  Social Connections:   . Frequency of Communication with Friends and Family: Not on file  . Frequency of Social Gatherings with Friends and Family: Not on file  . Attends Religious Services: Not on file  . Active Member of Clubs or Organizations: Not on file  . Attends Archivist Meetings: Not on file  . Marital Status: Not on file   Vitals:   10/17/19 0823  BP: 128/80  Pulse: 82  Resp: 16  Temp: 98 F (36.7 C)  SpO2: 98%   Body mass index is 29.85 kg/m.  Physical Exam Vitals and nursing note reviewed.  Constitutional:      General: She is not in acute distress.    Appearance: She is well-developed.  HENT:     Head: Normocephalic and atraumatic.  Eyes:     Conjunctiva/sclera: Conjunctivae normal.     Pupils: Pupils are equal, round, and reactive to light.  Cardiovascular:     Rate and Rhythm: Normal rate and regular rhythm.     Pulses:          Dorsalis pedis pulses are 2+ on the right side and 2+ on the left side.     Heart sounds: No murmur heard.   Pulmonary:     Effort: Pulmonary effort is normal. No respiratory distress.     Breath sounds: Normal breath sounds.  Abdominal:     Palpations: Abdomen is soft. There is no hepatomegaly or mass.     Tenderness: There is no abdominal tenderness.  Lymphadenopathy:     Cervical: No cervical adenopathy.  Skin:    General: Skin is warm.     Findings: No erythema  or rash.  Neurological:     Mental Status: She is alert and oriented to person, place, and time.     Cranial Nerves: No cranial nerve deficit.     Gait: Gait normal.  Psychiatric:     Comments: Well groomed, good eye contact.    ASSESSMENT AND PLAN:  Ms. Armida Vickroy was seen today for welcome to medicare  visit and follow-up.  Orders Placed This Encounter  Procedures  . DG Bone Density   Medicare annual wellness visit, initial We discussed the importance of staying active, physically and mentally, as well as the benefits of a healthy/balance diet. Low impact exercise that involve stretching and strengthing are ideal. Vaccines: For now she is not interested in influenza, Shingrix, or Pneumovax vaccination. We discussed preventive screening for the next 5-10 years, summery of recommendations given in AVS. DEXA will be arranged. Fall prevention. Colonoscopy: 05/03/2013, she is not sure about follow-up, she thinks 5 years were recommended. I could not find pathology report. She is planning on establishing with gastroenterologist in [facility] Mammogram: 10/2017, she is going to arrange appointment.  Advance directives and end of life discussed, she does not have power of attorney or living will, advanced directive package  given.   Gastroesophageal reflux disease without esophagitis Problem is well controlled with nonpharmacologic treatment. Continue GERD precautions.  Asymptomatic postmenopausal estrogen deficiency -     DG Bone Density; Future  Anxiety Problem is well controlled and being closer to family is going to help as well. Continue Effexor XR 75 mg daily. Follow with new PCP in [place], [place]  Hyperlipidemia, unspecified hyperlipidemia type Continue atorvastatin 40 mg daily. Cholesterol came to check again in 07/2020.  I asked her to call back with information about her new pharmacy, so refills can be sent.  Return in about 1 year (around 10/16/2020)  for with new PCP in [place].  Robert Sperl G. Martinique, MD  [facility]. [place] office.  Ms. Ternes , Thank you for taking time to come for your Medicare Wellness Visit. I appreciate your ongoing commitment to your health goals. Please review the following plan we discussed and let me know if I can assist you in the future.   These are the goals we discussed: Goals    .  Acknowledge receipt of Advanced Directive package     Review form and consider completing and sign.    . Exercise 3x per week (30 min per time)     Walking 15 min daily at least 3 times per week.       This is a list of the screening recommended for you and due dates:  Health Maintenance  Topic Date Due  . Flu Shot  09/03/2019  . Pneumonia vaccines (1 of 2 - PCV13) 07/29/2020*  . Mammogram  10/26/2019  . Colon Cancer Screening  05/04/2023  . Tetanus Vaccine  02/22/2028  . DEXA scan (bone density measurement)  Completed  . COVID-19 Vaccine  Completed  .  Hepatitis C: One time screening is recommended by Center for Disease Control  (CDC) for  adults born from [birth year] through 1967.   Completed  *Topic was postponed. The date shown is not the original due date.   You need shingles vaccine,pneumonia shot,and flu shot. Mammogram to be schedule and I ordered bone density.  A few things to remember from today's visit:  Medicare annual wellness visit, initial  Asymptomatic postmenopausal estrogen deficiency - Plan: DG Bone Density  Anxiety   If you need refills please call your pharmacy. Do not use My Chart to request refills or for acute issues that need immediate attention.    Please be sure medication list is accurate. If a new problem present, please set up appointment sooner than planned today. A few tips:  -As we age balance is not as good as it was, so there is a higher  risks for falls. Please remove small rugs and furniture that is "in your way" and could increase the risk of falls. Stretching  exercises may help with fall prevention: Yoga and Tai Chi are some examples. Low impact exercise is better, so you are not very achy the next day.  -Sun screen and avoidance of direct sun light recommended. Caution with dehydration, if working outdoors be sure to drink enough fluids.  - Some medications are not safe as we age, increases the risk of side effects and can potentially interact with other medication you are also taken;  including some of over the counter medications. Be sure to let me know when you start a new medication even if it is a dietary/vitamin supplement.   -Healthy diet low in red meet/animal fat and sugar + regular physical activity is recommended.

## 2019-10-19 ENCOUNTER — Encounter: Payer: Self-pay | Admitting: Family Medicine

## 2019-10-20 ENCOUNTER — Ambulatory Visit: Payer: Medicare Other | Admitting: Family Medicine

## 2019-12-19 ENCOUNTER — Telehealth: Payer: Self-pay | Admitting: Family Medicine

## 2019-12-19 NOTE — Telephone Encounter (Signed)
I think it is ok to go a head and get her covid 19 booster vaccine. Thanks, BJ

## 2019-12-19 NOTE — Telephone Encounter (Signed)
Patient is aware of message below.

## 2019-12-19 NOTE — Telephone Encounter (Signed)
Patient was told by Dr. Martinique to wait to schedule her Covid booster but patient scheduled Covid Booster shot for Friday in [place] and wants to know if this is okay. Please call [phone].

## 2020-03-22 LAB — HM MAMMOGRAPHY

## 2020-04-09 ENCOUNTER — Encounter: Payer: Self-pay | Admitting: Family Medicine

## 2020-05-03 ENCOUNTER — Other Ambulatory Visit: Payer: Self-pay | Admitting: Family Medicine

## 2020-05-03 DIAGNOSIS — F419 Anxiety disorder, unspecified: Secondary | ICD-10-CM

## 2020-05-08 ENCOUNTER — Other Ambulatory Visit: Payer: Self-pay | Admitting: Family Medicine

## 2020-05-08 DIAGNOSIS — E785 Hyperlipidemia, unspecified: Secondary | ICD-10-CM

## 2020-09-19 ENCOUNTER — Telehealth: Payer: Self-pay | Admitting: Family Medicine

## 2020-09-19 NOTE — Telephone Encounter (Signed)
Left message for patient to call back and schedule Medicare Annual Wellness Visit (AWV) either virtually or in office. Left my Lynn Bautista number [phone]   Last AWV  10/17/19  please schedule at anytime with [facility] Nurse Health Advisor 1 or 2   This should be a 45 minute visit.

## 2020-10-21 ENCOUNTER — Ambulatory Visit (INDEPENDENT_AMBULATORY_CARE_PROVIDER_SITE_OTHER): Payer: Medicare Other | Admitting: Family Medicine

## 2020-10-21 ENCOUNTER — Encounter: Payer: Self-pay | Admitting: Family Medicine

## 2020-10-21 ENCOUNTER — Other Ambulatory Visit: Payer: Self-pay

## 2020-10-21 VITALS — BP 110/70 | HR 86 | Resp 16 | Ht 61.0 in | Wt 157.5 lb

## 2020-10-21 DIAGNOSIS — R197 Diarrhea, unspecified: Secondary | ICD-10-CM | POA: Diagnosis not present

## 2020-10-21 DIAGNOSIS — E785 Hyperlipidemia, unspecified: Secondary | ICD-10-CM

## 2020-10-21 DIAGNOSIS — K760 Fatty (change of) liver, not elsewhere classified: Secondary | ICD-10-CM | POA: Diagnosis not present

## 2020-10-21 DIAGNOSIS — Z0000 Encounter for general adult medical examination without abnormal findings: Secondary | ICD-10-CM

## 2020-10-21 DIAGNOSIS — G4700 Insomnia, unspecified: Secondary | ICD-10-CM | POA: Diagnosis not present

## 2020-10-21 DIAGNOSIS — F419 Anxiety disorder, unspecified: Secondary | ICD-10-CM | POA: Diagnosis not present

## 2020-10-21 DIAGNOSIS — M7712 Lateral epicondylitis, left elbow: Secondary | ICD-10-CM

## 2020-10-21 LAB — COMPREHENSIVE METABOLIC PANEL
ALT: 19 U/L (ref 0–35)
AST: 14 U/L (ref 0–37)
Albumin: 4.1 g/dL (ref 3.5–5.2)
Alkaline Phosphatase: 86 U/L (ref 39–117)
BUN: 16 mg/dL (ref 6–23)
CO2: 29 mEq/L (ref 19–32)
Calcium: 9.1 mg/dL (ref 8.4–10.5)
Chloride: 107 mEq/L (ref 96–112)
Creatinine, Ser: 0.82 mg/dL (ref 0.40–1.20)
GFR: 73.98 mL/min (ref >60.00)
Glucose, Bld: 88 mg/dL (ref 70–99)
Potassium: 4 mEq/L (ref 3.5–5.1)
Sodium: 142 mEq/L (ref 135–145)
Total Bilirubin: 0.3 mg/dL (ref 0.2–1.2)
Total Protein: 6.9 g/dL (ref 6.0–8.3)

## 2020-10-21 LAB — CBC
HCT: 39.4 % (ref 36.0–46.0)
Hemoglobin: 13.1 g/dL (ref 12.0–15.0)
MCHC: 33.1 g/dL (ref 30.0–36.0)
MCV: 94.5 fl (ref 78.0–100.0)
Platelets: 335 10*3/uL (ref 150.0–400.0)
RBC: 4.17 Mil/uL (ref 3.87–5.11)
RDW: 12.9 % (ref 11.5–15.5)
WBC: 7.2 10*3/uL (ref 4.0–10.5)

## 2020-10-21 LAB — LIPID PANEL
Cholesterol: 157 mg/dL (ref 0–200)
HDL: 43.5 mg/dL (ref >39.00)
LDL Cholesterol: 101 mg/dL — ABNORMAL HIGH (ref 0–99)
NonHDL: 113.42
Total CHOL/HDL Ratio: 4
Triglycerides: 64 mg/dL (ref 0.0–149.0)
VLDL: 12.8 mg/dL (ref 0.0–40.0)

## 2020-10-21 LAB — TSH: TSH: 1.81 u[IU]/mL (ref 0.35–5.50)

## 2020-10-21 LAB — C-REACTIVE PROTEIN: CRP: <1 mg/dL (ref 0.5–20.0)

## 2020-10-21 MED ORDER — VENLAFAXINE HCL ER 75 MG PO CP24: ORAL_CAPSULE | ORAL | 2 refills | Status: DC

## 2020-10-21 NOTE — Patient Instructions (Signed)
Lynn Bautista , Thank you for taking time to come for your Medicare Wellness Visit. I appreciate your ongoing commitment to your health goals. Please review the following plan we discussed and let me know if I can assist you in the future.   These are the goals we discussed:  Goals       Acknowledge receipt of Advanced Directive package     Review form and consider completing and sign.     Exercise 3x per week (30 min per time)     Walking 15 min daily at least 3 times per week.        This is a list of the screening recommended for you and due dates:  Health Maintenance  Topic Date Due   Zoster (Shingles) Vaccine (1 of 2) Never done   COVID-19 Vaccine (3 - Booster for Pfizer series) 09/19/2019   Flu Shot  09/02/2020   Mammogram  03/22/2022   Colon Cancer Screening  05/04/2023   Tetanus Vaccine  02/22/2028   DEXA scan (bone density measurement)  Completed   Hepatitis C Screening: USPSTF Recommendation to screen - Ages 45-79 yo.  Completed   HPV Vaccine  Aged Out   A few things to remember from today's visit:   Medicare annual wellness visit, subsequent  Hyperlipidemia, unspecified hyperlipidemia type - Plan: Comprehensive metabolic panel, Lipid panel  Anxiety disorder, unspecified type  Fatty infiltration of liver  Diarrhea, unspecified type - Plan: C-reactive protein, CBC, TSH  Epicondylitis, lateral, left  Anxiety - Plan: venlafaxine XR (EFFEXOR-XR) 75 MG 24 hr capsule  If you need refills please call your pharmacy. Do not use My Chart to request refills or for acute issues that need immediate attention.    Please be sure medication list is accurate. If a new problem present, please set up appointment sooner than planned today.  A few tips:  -As we age balance is not as good as it was, so there is a higher risks for falls. Please remove small rugs and furniture that is "in your way" and could increase the risk of falls. Stretching exercises may help with fall  prevention: Yoga and Tai Chi are some examples. Low impact exercise is better, so you are not very achy the next day.  -Sun screen and avoidance of direct sun light recommended. Caution with dehydration, if working outdoors be sure to drink enough fluids.  - Some medications are not safe as we age, increases the risk of side effects and can potentially interact with other medication you are also taken;  including some of over the counter medications. Be sure to let me know when you start a new medication even if it is a dietary/vitamin supplement.  Let me know where we can send order for bone density and is interested in PT.  -Healthy diet low in red meet/animal fat and sugar + regular physical activity is recommended.     Tennis Elbow Tennis elbow is irritation and swelling (inflammation) in your outer forearm, near your elbow. Swelling affects the tissues that connect muscle to bone (tendons). Tennis elbow can happen playing any sport or doing any job where you use your elbow too much. It is caused by doing the same motion over and over. What are the causes? This condition is often caused by playing sports or doing work where you need to keep moving your forearm the same way. Sometimes, it may be caused by a sudden injury. What increases the risk? You are more likely  to get tennis elbow if you play tennis or another racket sport. You also have a higher risk if you often use your hands for work. This includes: People who use computers. Architect workers. People who work in a factory. Musicians. Cooks. Cashiers. What are the signs or symptoms? Pain and tenderness in your forearm and the outer part of your elbow. You may have pain all the time or only when you use your arm. A burning feeling. This starts in your elbow and spreads down your arm. A weak grip in your hand. How is this treated? Resting and icing your arm is often the first treatment. Your doctor may also  recommend: Medicines to reduce pain and swelling. An elbow strap. Physical therapy. This may include massage or exercises or both. An elbow brace. If these do not help your symptoms get better, your doctor may recommend surgery. Follow these instructions at home: If you have a brace or strap: Wear the brace or strap as told by your doctor. Take it off only as told by your doctor. Check the skin around the brace or strap every day. Tell your doctor if you see problems. Loosen it if your fingers: Tingle. Become numb. Turn cold and blue. Keep the brace or strap clean. If the brace or strap is not waterproof: Do not let it get wet. Cover it with a watertight covering when you take a bath or a shower. Managing pain, stiffness, and swelling  If told, put ice on the injured area. To do this: If you have a removable brace or strap, take it off as told by your doctor. Put ice in a plastic bag. Place a towel between your skin and the bag. Leave the ice on for 20 minutes, 2-3 times a day. Take off the ice if your skin turns bright red. This is very important. If you cannot feel pain, heat, or cold, you have a greater risk of damage to the area. Move your fingers often. Activity Rest your elbow and wrist. Avoid activities that can cause elbow problems as told by your doctor. Do exercises as told by your doctor. If you lift an object, lift it with your palm facing up. Lifestyle If your tennis elbow is caused by sports, check your equipment and make sure that: You are using it the right way. It fits you well. If your tennis elbow is caused by work or computer use, take breaks often to stretch your arm. Talk with your manager about how you can make your condition better at work. General instructions Take over-the-counter and prescription medicines only as told by your doctor. Do not smoke or use any products that contain nicotine or tobacco. If you need help quitting, ask your doctor. Keep  all follow-up visits. How is this prevented? Before and after being active: Warm up and stretch before being active. Cool down and stretch after being active. Give your body time to rest between activities. While being active: Make sure to use equipment that fits you. If you play tennis, put power in your stroke with your lower body. Avoid using your arm only. Maintain physical fitness. This includes: Strength. Flexibility. Endurance. Do exercises to strengthen the forearm muscles. Contact a doctor if: Your pain does not get better with treatment. Your pain gets worse. You have weakness in your forearm, hand, or fingers. You cannot feel your forearm, hand, or fingers. Get help right away if: Your pain is very bad. You cannot move your wrist. Summary Tennis elbow  is irritation and swelling (inflammation) in your outer forearm, near your elbow. Tennis elbow is caused by doing the same motion over and over. Rest your elbow and wrist. Avoid activities as told by your doctor. If told, put ice on the injured area for 20 minutes, 2-3 times a day. This information is not intended to replace advice given to you by your health care provider. Make sure you discuss any questions you have with your health care provider. Document Revised: 08/01/2019 Document Reviewed: 08/01/2019 Elsevier Patient Education  [place].

## 2020-10-21 NOTE — Progress Notes (Signed)
HPI: Lynn Bautista is a 65 y.o. female, who is here today for her AWV and annual follow up.  Last AWV: 10/17/19  She lives alone. Independent ADL's and IADL's. No falls in the past year and denies depression symptoms.  Functional Status Survey: Is the patient deaf or have difficulty hearing?: No Does the patient have difficulty seeing, even when wearing glasses/contacts?: No Does the patient have difficulty concentrating, remembering, or making decisions?: No Does the patient have difficulty walking or climbing stairs?: No Does the patient have difficulty dressing or bathing?: No Does the patient have difficulty doing errands alone such as visiting a doctor's office or shopping?: No  Fall Risk  10/21/2020 10/19/2019  Falls in the past year? 0 0  Number falls in past yr: 0 0  Injury with Fall? 0 0  Follow up Education provided Education provided   Providers she sees regularly:  Eye care provider: Seen in 05/2020. She does not feel like prescription eye glasses are helping.  Depression screen [facility] 2/9 10/21/2020  Decreased Interest 0  Down, Depressed, Hopeless 0  PHQ - 2 Score 0    Mini-Cog - 10/21/20 1056     Normal clock drawing test? yes    How many words correct? 3            Vision Screening - Comments:: Had eye exam in April.  Colonoscopy in 05/2013. Mammogram: 03/22/20. DEXA: 09/2015.  Immunization History  Administered Date(s) Administered   Influenza-Unspecified 11/05/2016   PFIZER(Purple Top)SARS-COV-2 Vaccination 03/26/2019, 04/19/2019   Tdap 02/21/2018   Left lateral elbow pain for the past 3-4 months ago. + Stiffness. Aching ,constant,max 5/10. No numbness or tingling.  No hx of trauma. No limitation of ROM. She is not taking OTC medication.  Hyperlipidemia: Currently on Atorvastatin 40 mg daily. She is trying to avoid fried food, She eats chicken and fish. Side effects from medication:None Hx of fatty liver.  Lab Results  Component  Value Date   CHOL 165 07/20/2019   HDL 46.10 07/20/2019   LDLCALC 96 07/20/2019   TRIG 115.0 07/20/2019   CHOLHDL 4 07/20/2019   Insomnia:  She is taking CBD oil for sleep, 8-10 hours, feels drowsy next day. She was Ambien before. Melatonin does not help.  Her granddaughter gave her gummies "calm and relax ", she tried and helped.Planning on getting a new bottle.  Anxiety: She is on Effexor XR 37.5 mg daily. Medication started in 2004, she has taken it since then on and off. Tolerating medication well.  Diarrhea: Intermittent for the past 1-2 years. Exacerbated by fast food and certain food, "explosive" diarrhea, urgency. She is worried about possible celiac. No associated abdominal pain,melena,or blood in stool. S/P cholecystectomy.  Review of Systems  Constitutional:  Positive for fatigue. Negative for activity change, appetite change and fever.  HENT:  Negative for mouth sores, nosebleeds, sore throat and trouble swallowing.   Eyes:  Negative for redness and visual disturbance.  Respiratory:  Negative for cough, shortness of breath and wheezing.   Cardiovascular:  Negative for chest pain, palpitations and leg swelling.  Gastrointestinal:  Negative for abdominal pain, nausea and vomiting.       Negative for changes in bowel habits.  Genitourinary:  Negative for decreased urine volume, difficulty urinating, dysuria and hematuria.  Musculoskeletal:  Positive for arthralgias. Negative for gait problem.  Neurological:  Negative for seizures, syncope, weakness, numbness and headaches.  Psychiatric/Behavioral:  Positive for sleep disturbance. Negative for confusion.  Current Outpatient Medications on File Prior to Visit  Medication Sig Dispense Refill   atorvastatin (LIPITOR) 40 MG tablet TAKE ONE TABLET BY MOUTH DAILY 90 tablet 1   diclofenac Sodium (VOLTAREN) 1 % GEL Apply 4 g topically 4 (four) times daily as needed. 500 g 6   No current facility-administered medications  on file prior to visit.   Past Medical History:  Diagnosis Date   Anxiety    GERD (gastroesophageal reflux disease)    Hyperlipidemia    Past Surgical History:  Procedure Laterality Date   ABDOMINAL HYSTERECTOMY     APPENDECTOMY     BREAST DUCTAL SYSTEM EXCISION Right 01/02/2015   Procedure: RIGHT BREAST CENTRAL DUCT EXCISION;  Surgeon: Erroll Luna, MD;  Location: [place];  Service: General;  Laterality: Right;   BREAST EXCISIONAL BIOPSY Right    benign   BUNIONECTOMY Bilateral    CHOLECYSTECTOMY     TONSILLECTOMY      No Known Allergies  Family History  Problem Relation Age of Onset   Cancer Mother        Uterine and Breast   Heart attack Mother    Hyperlipidemia Mother    Breast cancer Mother 67   Cancer Father        prostate cancer   Heart attack Brother 74   Hyperlipidemia Brother    Hyperlipidemia Daughter    Social History   Socioeconomic History   Marital status: Divorced    Spouse name: Not on file   Number of children: 2   Years of education: Not on file   Highest education level: Not on file  Occupational History   Occupation: Facilities manager: [employer]  Tobacco Use   Smoking status: Every Day    Packs/day: 0.50    Types: Cigarettes    Start date: 04/27/1968   Smokeless tobacco: Never  Substance and Sexual Activity   Alcohol use: Yes    Alcohol/week: 8.0 standard drinks    Types: 8 Standard drinks or equivalent per week    Comment: social   Drug use: No   Sexual activity: Not on file  Other Topics Concern   Not on file  Social History Narrative   Not on file   Social Determinants of Health   Financial Resource Strain: Not on file  Food Insecurity: Not on file  Transportation Needs: Not on file  Physical Activity: Not on file  Stress: Not on file  Social Connections: Not on file   Vitals:   10/21/20 1024  BP: 110/70  Pulse: 86  Resp: 16  SpO2: 97%   Wt Readings from Last 3 Encounters:   10/21/20 157 lb 8 oz (71.4 kg)  10/17/19 158 lb (71.7 kg)  07/26/19 158 lb 4 oz (71.8 kg)   Body mass index is 29.76 kg/m.  Wt Readings from Last 3 Encounters:  10/21/20 157 lb 8 oz (71.4 kg)  10/17/19 158 lb (71.7 kg)  07/26/19 158 lb 4 oz (71.8 kg)   Physical Exam Vitals and nursing note reviewed.  Constitutional:      General: She is not in acute distress.    Appearance: She is well-developed.  HENT:     Head: Normocephalic and atraumatic.     Mouth/Throat:     Mouth: Mucous membranes are moist.     Pharynx: Oropharynx is clear.  Eyes:     Conjunctiva/sclera: Conjunctivae normal.  Cardiovascular:     Rate and Rhythm: Normal rate  and regular rhythm.     Pulses:          Dorsalis pedis pulses are 2+ on the right side and 2+ on the left side.     Heart sounds: No murmur heard. Pulmonary:     Effort: Pulmonary effort is normal. No respiratory distress.     Breath sounds: Normal breath sounds.  Abdominal:     Palpations: Abdomen is soft. There is no hepatomegaly or mass.     Tenderness: There is no abdominal tenderness.  Musculoskeletal:     Left elbow: No deformity or effusion. Normal range of motion. Tenderness present in lateral epicondyle.     Comments: Pain elicited with supination with resistance.  Lymphadenopathy:     Cervical: No cervical adenopathy.  Skin:    General: Skin is warm.     Findings: No erythema or rash.  Neurological:     General: No focal deficit present.     Mental Status: She is alert and oriented to person, place, and time.     Cranial Nerves: No cranial nerve deficit.     Gait: Gait normal.  Psychiatric:     Comments: Well groomed, good eye contact.   ASSESSMENT AND PLAN:  Ms. Mirabel Boehler was here today annual physical examination.  Orders Placed This Encounter  Procedures   C-reactive protein   CBC   Comprehensive metabolic panel   Lipid panel   TSH   Lab Results  Component Value Date   WBC 7.2 10/21/2020   HGB 13.1  10/21/2020   HCT 39.4 10/21/2020   MCV 94.5 10/21/2020   PLT 335.0 10/21/2020    Lab Results  Component Value Date   TSH 1.81 10/21/2020   Lab Results  Component Value Date   CREATININE 0.82 10/21/2020   BUN 16 10/21/2020   NA 142 10/21/2020   K 4.0 10/21/2020   CL 107 10/21/2020   CO2 29 10/21/2020   Lab Results  Component Value Date   ALT 19 10/21/2020   AST 14 10/21/2020   ALKPHOS 86 10/21/2020   BILITOT 0.3 10/21/2020   Lab Results  Component Value Date   CHOL 157 10/21/2020   HDL 43.50 10/21/2020   LDLCALC 101 (H) 10/21/2020   TRIG 64.0 10/21/2020   CHOLHDL 4 10/21/2020   Medicare annual wellness visit, subsequent We discussed the importance of staying active, physically and mentally, as well as the benefits of a healthy/balance diet. Low impact exercise that involve stretching and strengthing are ideal. Vaccines: She needs her 2nd Shingrix, pneumovax. She is not interested in flu or pneumonia vaccine today.  DEXA is due, she will let me know where in [place] she can have it. We discussed preventive screening for the next 5-10 years, summery of recommendations given in AVS.   Goals       Acknowledge receipt of Advanced Directive package     Review form and consider completing and sign.     Exercise 3x per week (30 min per time)     Walking 15 min daily at least 3 times per week.        This is a list of the screening recommended for you and due dates:  Health Maintenance  Topic Date Due   Zoster (Shingles) Vaccine (1 of 2) Never done   COVID-19 Vaccine (3 - Booster for [place] series) 09/19/2019   Flu Shot  05/02/2021*   Mammogram  03/22/2022   Colon Cancer Screening  05/04/2023  Tetanus Vaccine  02/22/2028   DEXA scan (bone density measurement)  Completed   Hepatitis C Screening: USPSTF Recommendation to screen - Ages 72-79 yo.  Completed   HPV Vaccine  Aged Out  *Topic was postponed. The date shown is not the original due date.   Fall  prevention. Advance directives and end of life discussed, she does not have POA or living will.Advance health directive package given today.   Insomnia, unspecified type OTC treatment seems to help, so no changes. Good sleep hygiene. F/U as needed.  Hyperlipidemia, unspecified hyperlipidemia type Continue Atorvastatin 40 mg daily and low fat diet. Further recommendations according to lipid panel results.  The 10-year ASCVD risk score (Arnett DK, et al., 2019) is: 8.7%   Values used to calculate the score:     Age: 66 years     Sex: Female     Is Non-Hispanic African American: No     Diabetic: No     Tobacco smoker: Yes     Systolic Blood Pressure: [id number] mmHg     Is BP treated: No     HDL Cholesterol: 43.5 mg/dL     Total Cholesterol: 157 mg/dL  Fatty infiltration of liver Continue low fat diet. Wt loss will help.  Diarrhea, unspecified type We discussed possible etiologies. Hx does not suggest celiac disease. ? Post cholecystectomy synd. I do not think imaging studies are needed at this time. Avoid foods that trigger problem. Further recommendations according to lab results.  Epicondylitis, lateral, left We discussed Dx,prognosis,and treatment options. PT recommended, she will let me know if there is a provider closer home she would like to see. Topical Voltaren or Lidocaine patch on affected area may help. Local ice.  Anxiety disorder, unspecified type Stable. Continue Effexor XR same dose.  -     venlafaxine XR (EFFEXOR-XR) 75 MG 24 hr capsule; TAKE ONE CAPSULE BY MOUTH DAILY WITH BREAKFAST   Return in about 1 year (around 10/21/2021) for awv and f/u.  Elek Holderness G. Martinique, MD  [facility]. [place] office.

## 2020-10-25 ENCOUNTER — Telehealth: Payer: Self-pay | Admitting: Family Medicine

## 2020-10-25 NOTE — Telephone Encounter (Signed)
Called and spoke with patient about scheduling her AWV, patient declined scheduling. Patient  stated that she does not feel that she needs it since she just saw her provider.

## 2020-10-26 ENCOUNTER — Encounter: Payer: Self-pay | Admitting: Family Medicine

## 2020-10-26 MED ORDER — ATORVASTATIN CALCIUM 40 MG PO TABS: 40.0000 mg | ORAL_TABLET | Freq: Every day | ORAL | 3 refills | Status: DC

## 2020-10-26 NOTE — Telephone Encounter (Signed)
She had her AWV on 10/21/20. Chidubem Chaires Martinique, MD

## 2021-07-01 LAB — HM MAMMOGRAPHY

## 2021-07-02 ENCOUNTER — Encounter: Payer: Self-pay | Admitting: Family Medicine

## 2021-09-10 ENCOUNTER — Telehealth (INDEPENDENT_AMBULATORY_CARE_PROVIDER_SITE_OTHER): Payer: Medicare Other | Admitting: Family Medicine

## 2021-09-10 ENCOUNTER — Encounter: Payer: Self-pay | Admitting: Family Medicine

## 2021-09-10 ENCOUNTER — Other Ambulatory Visit: Payer: Self-pay

## 2021-09-10 ENCOUNTER — Telehealth: Payer: Self-pay | Admitting: Family Medicine

## 2021-09-10 VITALS — Ht 61.0 in

## 2021-09-10 DIAGNOSIS — J069 Acute upper respiratory infection, unspecified: Secondary | ICD-10-CM

## 2021-09-10 DIAGNOSIS — E2839 Other primary ovarian failure: Secondary | ICD-10-CM

## 2021-09-10 DIAGNOSIS — Z13820 Encounter for screening for osteoporosis: Secondary | ICD-10-CM

## 2021-09-10 MED ORDER — FLUTICASONE PROPIONATE 50 MCG/ACT NA SUSP: 1.0000 | Freq: Two times a day (BID) | NASAL | 0 refills | Status: DC

## 2021-09-10 NOTE — Progress Notes (Signed)
Virtual Visit via Video Note I connected with Lynn Bautista on 09/10/21 by a video enabled telemedicine application and verified that I am speaking with the correct person using two identifiers.  Location patient: home Location provider:work office Persons participating in the virtual visit: patient, provider  I discussed the limitations of evaluation and management by telemedicine and the availability of in person appointments. The patient expressed understanding and agreed to proceed.  Chief Complaint  Patient presents with   Headache   Sore Throat   Nasal Congestion   Cough   HPI: Lynn Bautista is a 66 yo female with hx of hyperlipidemia, GERD, and anxiety complaining of 10 days of respiratory symptoms as described above. Day 1 she had several episodes of diarrhea for about 45 minutes. Some of her symptoms have improved. Sore throat and headache have resolved. Mild nasal congestion and rhinorrhea, mainly in the morning. Nonproductive cough, postnasal drainage, and dysphonia.  Negative for fever, chills, anosmia, ageusia, CP, palpitations, dyspnea, wheezing, abdominal pain, nausea, vomiting, urinary symptoms, or skin rash.  Negative for sick contacts or recent travel. She performed a home COVID-19 test, which was negative. She has taken Cold Calm.  ROS: See pertinent positives and negatives per HPI.  Past Medical History:  Diagnosis Date   Anxiety    GERD (gastroesophageal reflux disease)    Hyperlipidemia     Past Surgical History:  Procedure Laterality Date   ABDOMINAL HYSTERECTOMY     APPENDECTOMY     BREAST DUCTAL SYSTEM EXCISION Right 01/02/2015   Procedure: RIGHT BREAST CENTRAL DUCT EXCISION;  Surgeon: Erroll Luna, MD;  Location: [place];  Service: General;  Laterality: Right;   BREAST EXCISIONAL BIOPSY Right    benign   BUNIONECTOMY Bilateral    CHOLECYSTECTOMY     TONSILLECTOMY      Family History  Problem Relation Age of Onset   Cancer  Mother        Uterine and Breast   Heart attack Mother    Hyperlipidemia Mother    Breast cancer Mother 20   Cancer Father        prostate cancer   Heart attack Brother 42   Hyperlipidemia Brother    Hyperlipidemia Daughter     Social History   Socioeconomic History   Marital status: Divorced    Spouse name: Not on file   Number of children: 2   Years of education: Not on file   Highest education level: Not on file  Occupational History   Occupation: Facilities manager: [employer]  Tobacco Use   Smoking status: Every Day    Packs/day: 0.50    Types: Cigarettes    Start date: 04/27/1968   Smokeless tobacco: Never  Substance and Sexual Activity   Alcohol use: Yes    Alcohol/week: 8.0 standard drinks of alcohol    Types: 8 Standard drinks or equivalent per week    Comment: social   Drug use: No   Sexual activity: Not on file  Other Topics Concern   Not on file  Social History Narrative   Not on file   Social Determinants of Health   Financial Resource Strain: Not on file  Food Insecurity: Not on file  Transportation Needs: Not on file  Physical Activity: Not on file  Stress: Not on file  Social Connections: Not on file  Intimate Partner Violence: Not on file   Current Outpatient Medications:    atorvastatin (LIPITOR) 40 MG tablet,  Take 1 tablet (40 mg total) by mouth daily., Disp: 90 tablet, Rfl: 3   diclofenac Sodium (VOLTAREN) 1 % GEL, Apply 4 g topically 4 (four) times daily as needed., Disp: 500 g, Rfl: 6   venlafaxine XR (EFFEXOR-XR) 75 MG 24 hr capsule, TAKE ONE CAPSULE BY MOUTH DAILY WITH BREAKFAST, Disp: 90 capsule, Rfl: 2  EXAM:  VITALS per patient if applicable:Ht '5\' 1"'$  (1.549 m)   BMI 29.76 kg/m   GENERAL: alert, oriented, appears well and in no acute distress  HEENT: atraumatic, conjunctiva clear, no obvious abnormalities on inspection of external nose and ears Mild dysphonia.  NECK: normal movements of the head and  neck  LUNGS: on inspection no signs of respiratory distress, breathing rate appears normal, no obvious gross SOB, gasping or wheezing. Productive cough a few times during visit.  CV: no obvious cyanosis  MS: moves all visible extremities without noticeable abnormality  PSYCH/NEURO: pleasant and cooperative, no obvious depression or anxiety, speech and thought processing grossly intact  ASSESSMENT AND PLAN:  Discussed the following assessment and plan:  URI, acute - Plan: fluticasone (FLONASE) 50 MCG/ACT nasal spray Symptoms suggests a viral etiology, most symptoms have improved and now having residual cough and nasal congestion. I do not think abx is needed at this time. Instructed to monitor for signs of complications, including new onset of fever among some,instructed about warning signs. Plain Mucinex and Flonase nasal spray to help with congestion and postnasal drainage. Instructed to let us know if cough is not greatly improved in about a week, may need to arrange chest x-ray  F/U as needed.  We discussed possible serious and likely etiologies, options for evaluation and workup, limitations of telemedicine visit vs in person visit, treatment, treatment risks and precautions. The patient was advised to call back or seek an in-person evaluation if the symptoms worsen or if the condition fails to improve as anticipated. I discussed the assessment and treatment plan with the patient. The patient was provided an opportunity to ask questions and all were answered. The patient agreed with the plan and demonstrated an understanding of the instructions.  Return if symptoms worsen or fail to improve.  Urania Pearlman G. [place], MD  [facility]. [place] office.

## 2021-09-10 NOTE — Telephone Encounter (Signed)
Pt also requesting an order for a bone density test. Will be in [facility] for an appointment 10/24/21. Can send the order to the facility where she had her mammogram done in [place]. Says notes should be in mychart

## 2021-09-10 NOTE — Telephone Encounter (Signed)
Pt states she has headache, sore throat, congestion, cough x10d. Tested negative for covid with home test. States she is in [place] and it would be difficult to come to an OV and is asking for something to assist with symptoms. Declined OV, please advise

## 2021-09-10 NOTE — Telephone Encounter (Signed)
She would at least need a virtual appointment.

## 2021-09-18 ENCOUNTER — Other Ambulatory Visit: Payer: Self-pay | Admitting: Family Medicine

## 2021-09-18 DIAGNOSIS — F419 Anxiety disorder, unspecified: Secondary | ICD-10-CM

## 2021-09-18 NOTE — Telephone Encounter (Signed)
Pt called to request a refill for the venlafaxine XR (EFFEXOR-XR) 75 MG 24 hr capsule   LOV:  10/17/2019 LVV:  09/10/2021 CPE scheduled for:  10/27/21  Please advise.   [facility] [id number], [place] Phone:  [phone]  Fax:  [phone]

## 2021-10-24 ENCOUNTER — Encounter: Payer: Medicare Other | Admitting: Family Medicine

## 2021-10-24 NOTE — Progress Notes (Unsigned)
HPI: Lynn Bautista is a 66 y.o. female with medical hx significant for chronic diarrhea,fatty liver (seen on imaging), anxiety,HLD, and insomnia here today for her routine AWV and follow up.  Last AWV : 10/21/20. No new problems since her last visit.  Independent ADL's and IADL's. No falls in the past year and denies depression symptoms. She is not longer working part time.  Functional Status Survey: Is the patient deaf or have difficulty hearing?: No Does the patient have difficulty seeing, even when wearing glasses/contacts?: No Does the patient have difficulty concentrating, remembering, or making decisions?: No Does the patient have difficulty walking or climbing stairs?: No Does the patient have difficulty dressing or bathing?: No Does the patient have difficulty doing errands alone such as visiting a doctor's office or shopping?: No     10/27/2021   10:32 AM 10/21/2020   10:24 AM 10/19/2019    9:31 PM  [place] in the past year? 0 0 0  Number falls in past yr: 0 0 0  Injury with Fall? 0 0 0  Risk for fall due to : Other (Comment)    Follow up Falls evaluation completed;Education provided Education provided Education provided   Providers she sees regularly: Eye care provider: Last seen about a year ago.     10/27/2021   10:32 AM  Depression screen PHQ 2/9  Decreased Interest 0  Down, Depressed, Hopeless 0  PHQ - 2 Score 0    Mini-Cog - 10/27/21 1048     Normal clock drawing test? yes    How many words correct? 3            Vision Screening   Right eye Left eye Both eyes  Without correction     With correction '20/20 20/20 20/20 '$   Immunization History  Administered Date(s) Administered   Fluad Quad(high Dose 65+) 10/27/2021   Influenza-Unspecified 11/05/2016   PFIZER(Purple Top)SARS-COV-2 Vaccination 03/26/2019, 04/19/2019   Tdap 02/21/2018   Health Maintenance  Topic Date Due   Zoster Vaccines- Shingrix (1 of 2) Never done   COVID-19  Vaccine (3 - Pfizer series) 06/14/2019   Pneumonia Vaccine 67+ Years old (1 - PCV) 10/31/2022 (Originally 04/28/2018)   COLONOSCOPY (Pts 45-73yr Insurance coverage will need to be confirmed)  05/04/2023   MAMMOGRAM  07/02/2023   TETANUS/TDAP  02/22/2028   INFLUENZA VACCINE  Completed   DEXA SCAN  Completed   Hepatitis C Screening  Completed   HPV VACCINES  Aged Out   She cancelled DEXA appt because could not be in town around that time. [place] to find a new PCP closer home.  HLD: She is on Atorvastatin 40 mg daily. Tolerating medications well. When she is at home she follows a healthier diet. She gained some wt [place] is active with chores around her house and baby sitting her grandchild.  Lab Results  Component Value Date   CHOL 157 10/21/2020   HDL 43.50 10/21/2020   LDLCALC 101 (H) 10/21/2020   TRIG 64.0 10/21/2020   CHOLHDL 4 10/21/2020   Lab Results  Component Value Date   ALT 19 10/21/2020   AST 14 10/21/2020   ALKPHOS 86 10/21/2020   BILITOT 0.3 10/21/2020   Anxiety on Effexor XR 37.5 mg daily. She has been on medication since 2004, intermittently.  She has no new concerns today.  Review of Systems  Constitutional:  Negative for  activity change, appetite change and fever.  HENT:  Negative for mouth sores, nosebleeds and sore throat.   Eyes:  Negative for redness and visual disturbance.  Respiratory:  Negative for cough, shortness of breath and wheezing.   Cardiovascular:  Negative for chest pain, palpitations and leg swelling.  Gastrointestinal:  Negative for abdominal pain, nausea and vomiting.       Negative for changes in bowel habits.  Endocrine: Negative for cold intolerance and heat intolerance.  Genitourinary:  Negative for decreased urine volume, dysuria and hematuria.  Skin:  Negative for rash.  Neurological:  Negative for syncope, weakness and headaches.   Psychiatric/Behavioral:  Positive for sleep disturbance. Negative for confusion. The patient is nervous/anxious.    Current Outpatient Medications on File Prior to Visit  Medication Sig Dispense Refill   atorvastatin (LIPITOR) 40 MG tablet Take 1 tablet (40 mg total) by mouth daily. 90 tablet 3   venlafaxine XR (EFFEXOR-XR) 75 MG 24 hr capsule TAKE ONE CAPSULE BY MOUTH DAILY WITH BREAKFAST 90 capsule 1   No current facility-administered medications on file prior to visit.   Past Medical History:  Diagnosis Date   Anxiety    GERD (gastroesophageal reflux disease)    Hyperlipidemia    Past Surgical History:  Procedure Laterality Date   ABDOMINAL HYSTERECTOMY     APPENDECTOMY     BREAST DUCTAL SYSTEM EXCISION Right 01/02/2015   Procedure: RIGHT BREAST CENTRAL DUCT EXCISION;  Surgeon: Erroll Luna, MD;  Location: [facility];  Service: General;  Laterality: Right;   BREAST EXCISIONAL BIOPSY Right    benign   BUNIONECTOMY Bilateral    CHOLECYSTECTOMY     TONSILLECTOMY    No Known Allergies  Family History  Problem Relation Age of Onset   Cancer Mother        Uterine and Breast   Heart attack Mother    Hyperlipidemia Mother    Breast cancer Mother 81   Cancer Father        prostate cancer   Heart attack Brother 78   Hyperlipidemia Brother    Hyperlipidemia Daughter    Social History   Socioeconomic History   Marital status: Divorced    Spouse name: Not on file   Number of children: 2   Years of education: Not on file   Highest education level: Not on file  Occupational History   Occupation: Facilities manager: [employer]  Tobacco Use   Smoking status: Former    Packs/day: 0.25    Years: 35.00    Total pack years: 8.75    Types: Cigarettes    Start date: 04/27/1968    Quit date: 09/23/2021    Years since quitting: 0.1   Smokeless tobacco: Never   Tobacco comments:    0.5 PPD for 12 year, rest 1-3 ci, 1 pack lasted 2 weeks.   Substance and Sexual Activity   Alcohol use: Yes    Alcohol/week: 8.0 standard drinks of alcohol    Types: 8 Standard drinks or equivalent per week    Comment: social   Drug use: No   Sexual activity: Not on file  Other Topics Concern   Not on file  Social History Narrative   Not on file   Social Determinants of Health   Financial Resource Strain: Not on file  Food Insecurity: Not on file  Transportation Needs: Not on file  Physical Activity: Not on file  Stress: Not on file  Social Connections:  Not on file   Vitals:   10/27/21 1019  BP: 122/70  Pulse: 71  Resp: 12  Temp: 98.8 F (37.1 C)  SpO2: 97%  Body mass index is 30.63 kg/m. Wt Readings from Last 3 Encounters:  10/27/21 162 lb 2 oz (73.5 kg)  10/21/20 157 lb 8 oz (71.4 kg)  10/17/19 158 lb (71.7 kg)  Physical Exam Vitals and nursing note reviewed.  Constitutional:      General: She is not in acute distress.    Appearance: She is well-developed.  HENT:     Head: Normocephalic and atraumatic.     Mouth/Throat:     Mouth: Mucous membranes are moist.     Pharynx: Oropharynx is clear.  Eyes:     Conjunctiva/sclera: Conjunctivae normal.  Cardiovascular:     Rate and Rhythm: Normal rate and regular rhythm.     Pulses:          Dorsalis pedis pulses are 2+ on the right side and 2+ on the left side.     Heart sounds: No murmur heard. Pulmonary:     Effort: Pulmonary effort is normal. No respiratory distress.     Breath sounds: Normal breath sounds.  Abdominal:     Palpations: Abdomen is soft. There is no hepatomegaly or mass.     Tenderness: There is no abdominal tenderness.  Lymphadenopathy:     Cervical: No cervical adenopathy.  Skin:    General: Skin is warm.     Findings: No erythema or rash.  Neurological:     General: No focal deficit present.     Mental Status: She is alert and oriented to person, place, and time.     Cranial Nerves: No cranial nerve deficit.     Gait: Gait normal.   Psychiatric:        Mood and Affect: Mood and affect normal.   ASSESSMENT AND PLAN:  Ms. Tianne Plott was here today annual physical examination.  Orders Placed This Encounter  Procedures   Flu Vaccine QUAD High Dose(Fluad)   Comprehensive metabolic panel   Lipid panel   Lab Results  Component Value Date   CHOL 157 10/27/2021   HDL 51.40 10/27/2021   LDLCALC 87 10/27/2021   TRIG 96.0 10/27/2021   CHOLHDL 3 10/27/2021   Lab Results  Component Value Date   CREATININE 0.70 10/27/2021   BUN 11 10/27/2021   NA 142 10/27/2021   K 3.7 10/27/2021   CL 107 10/27/2021   CO2 28 10/27/2021   Lab Results  Component Value Date   ALT 27 10/27/2021   AST 18 10/27/2021   ALKPHOS 92 10/27/2021   BILITOT 0.7 10/27/2021   Medicare annual wellness visit, subsequent We discussed the importance of staying active, physically and mentally, as well as the benefits of a healthy/balance diet. Low impact exercise that involve stretching and strengthing are ideal. Vaccines: Prefers to hold on pneumonia vaccine,shingrix recommended.  We discussed preventive screening for the next 5-10 years, summery of recommendations given in AVS.  Health Maintenance  Topic Date Due   Zoster (Shingles) Vaccine (1 of 2) Never done   COVID-19 Vaccine (3 - Pfizer series) 06/14/2019   Pneumonia Vaccine (1 - PCV) 10/31/2022*   Colon Cancer Screening  05/04/2023   Mammogram  07/02/2023   Tetanus Vaccine  02/22/2028   Flu Shot  Completed   DEXA scan (bone density measurement)  Completed   Hepatitis C Screening: USPSTF Recommendation to screen - Ages 18-79 yo.  Completed   HPV Vaccine  Aged Out  *Topic was postponed. The date shown is not the original due date.   Fall prevention.  Advance directives and end of life discussed, does not have POA or living will. She still has booklet given last year.   Hyperlipidemia, unspecified hyperlipidemia type Continue Atorvastatin 40 mg daily and low fat  diet. Further recommendations according to FLP result. The 10-year ASCVD risk score (Arnett DK, et al., 2019) is: 6.5%   Values used to calculate the score:     Age: 8 years     Sex: Female     Is Non-Hispanic African American: No     Diabetic: No     Tobacco smoker: No     Systolic Blood Pressure: 622 mmHg     Is BP treated: No     HDL Cholesterol: 51.4 mg/dL     Total Cholesterol: 157 mg/dL  Anxiety disorder, unspecified type Stable. Continue Effexor XR 75 mg daily.  Need for influenza vaccination -     Flu Vaccine QUAD High Dose(Fluad)  Return in about 1 year (around 10/28/2022) for AWV and f/u in [place] with new PCP.Marland Kitchen  Aaliya Maultsby G. [place], MD  [facility] [place] office.

## 2021-10-27 ENCOUNTER — Encounter: Payer: Self-pay | Admitting: Family Medicine

## 2021-10-27 ENCOUNTER — Ambulatory Visit (INDEPENDENT_AMBULATORY_CARE_PROVIDER_SITE_OTHER): Payer: Medicare Other | Admitting: Family Medicine

## 2021-10-27 VITALS — BP 122/70 | HR 71 | Temp 98.8°F | Resp 12 | Ht 61.0 in | Wt 162.1 lb

## 2021-10-27 DIAGNOSIS — E785 Hyperlipidemia, unspecified: Secondary | ICD-10-CM

## 2021-10-27 DIAGNOSIS — Z0000 Encounter for general adult medical examination without abnormal findings: Secondary | ICD-10-CM | POA: Diagnosis not present

## 2021-10-27 DIAGNOSIS — Z23 Encounter for immunization: Secondary | ICD-10-CM

## 2021-10-27 DIAGNOSIS — F419 Anxiety disorder, unspecified: Secondary | ICD-10-CM | POA: Diagnosis not present

## 2021-10-27 LAB — LIPID PANEL
Cholesterol: 157 mg/dL (ref 0–200)
HDL: 51.4 mg/dL (ref >39.00)
LDL Cholesterol: 87 mg/dL (ref 0–99)
NonHDL: 105.91
Total CHOL/HDL Ratio: 3
Triglycerides: 96 mg/dL (ref 0.0–149.0)
VLDL: 19.2 mg/dL (ref 0.0–40.0)

## 2021-10-27 LAB — COMPREHENSIVE METABOLIC PANEL
ALT: 27 U/L (ref 0–35)
AST: 18 U/L (ref 0–37)
Albumin: 4.2 g/dL (ref 3.5–5.2)
Alkaline Phosphatase: 92 U/L (ref 39–117)
BUN: 11 mg/dL (ref 6–23)
CO2: 28 mEq/L (ref 19–32)
Calcium: 8.9 mg/dL (ref 8.4–10.5)
Chloride: 107 mEq/L (ref 96–112)
Creatinine, Ser: 0.7 mg/dL (ref 0.40–1.20)
GFR: 88.82 mL/min (ref >60.00)
Glucose, Bld: 99 mg/dL (ref 70–99)
Potassium: 3.7 mEq/L (ref 3.5–5.1)
Sodium: 142 mEq/L (ref 135–145)
Total Bilirubin: 0.7 mg/dL (ref 0.2–1.2)
Total Protein: 7 g/dL (ref 6.0–8.3)

## 2021-10-27 NOTE — Patient Instructions (Addendum)
Lynn Bautista , Thank you for taking time to come for your Medicare Wellness Visit. I appreciate your ongoing commitment to your health goals. Please review the following plan we discussed and let me know if I can assist you in the future.   These are the goals we discussed:  Goals       Acknowledge receipt of Advanced Directive package     Review form and consider completing and sign.     Exercise 3x per week (30 min per time)     Walking 15 min daily at least 3 times per week.        This is a list of the screening recommended for you and due dates:  Health Maintenance  Topic Date Due   Pneumonia Vaccine (1 - PCV) Never done   Zoster (Shingles) Vaccine (1 of 2) Never done   COVID-19 Vaccine (3 - Pfizer series) 06/14/2019   Flu Shot  09/02/2021   Colon Cancer Screening  05/04/2023   Mammogram  07/02/2023   Tetanus Vaccine  02/22/2028   DEXA scan (bone density measurement)  Completed   Hepatitis C Screening: USPSTF Recommendation to screen - Ages 31-79 yo.  Completed   HPV Vaccine  Aged Out   A few things to remember from today's visit:   Medicare annual wellness visit, subsequent  Hyperlipidemia, unspecified hyperlipidemia type - Plan: Comprehensive metabolic panel, Lipid panel  Anxiety disorder, unspecified type  If you need refills for medications you take chronically, please call your pharmacy. Do not use My Chart to request refills or for acute issues that need immediate attention. If you send a my chart message, it may take a few days to be addressed, specially if I am not in the office.  Please be sure medication list is accurate. If a new problem present, please set up appointment sooner than planned today.  Preventive Care 70 Years and Older, Female Preventive care refers to lifestyle choices and visits with your health care provider that can promote health and wellness. Preventive care visits are also called wellness exams. What can I expect for my preventive  care visit? Counseling Your health care provider may ask you questions about your: Medical history, including: Past medical problems. Family medical history. Pregnancy and menstrual history. History of falls. Current health, including: Memory and ability to understand (cognition). Emotional well-being. Home life and relationship well-being. Sexual activity and sexual health. Lifestyle, including: Alcohol, nicotine or tobacco, and drug use. Access to firearms. Diet, exercise, and sleep habits. Work and work Statistician. Sunscreen use. Safety issues such as seatbelt and bike helmet use. Physical exam Your health care provider will check your: Height and weight. These may be used to calculate your BMI (body mass index). BMI is a measurement that tells if you are at a healthy weight. Waist circumference. This measures the distance around your waistline. This measurement also tells if you are at a healthy weight and may help predict your risk of certain diseases, such as type 2 diabetes and high blood pressure. Heart rate and blood pressure. Body temperature. Skin for abnormal spots. What immunizations do I need?  Vaccines are usually given at various ages, according to a schedule. Your health care provider will recommend vaccines for you based on your age, medical history, and lifestyle or other factors, such as travel or where you work. What tests do I need? Screening Your health care provider may recommend screening tests for certain conditions. This may include: Lipid and cholesterol levels. Hepatitis  C test. Hepatitis B test. HIV (human immunodeficiency virus) test. STI (sexually transmitted infection) testing, if you are at risk. Lung cancer screening. Colorectal cancer screening. Diabetes screening. This is done by checking your blood sugar (glucose) after you have not eaten for a while (fasting). Mammogram. Talk with your health care provider about how often you should  have regular mammograms. BRCA-related cancer screening. This may be done if you have a family history of breast, ovarian, tubal, or peritoneal cancers. Bone density scan. This is done to screen for osteoporosis. Talk with your health care provider about your test results, treatment options, and if necessary, the need for more tests. Follow these instructions at home: Eating and drinking  Eat a diet that includes fresh fruits and vegetables, whole grains, lean protein, and low-fat dairy products. Limit your intake of foods with high amounts of sugar, saturated fats, and salt. Take vitamin and mineral supplements as recommended by your health care provider. Do not drink alcohol if your health care provider tells you not to drink. If you drink alcohol: Limit how much you have to 0-1 drink a day. Know how much alcohol is in your drink. In the [place] one drink equals one 12 oz bottle of beer (355 mL), one 5 oz glass of wine (148 mL), or one 1 oz glass of hard liquor (44 mL). Lifestyle Brush your teeth every morning and night with fluoride toothpaste. Floss one time each day. Exercise for at least 30 minutes 5 or more days each week. Do not use any products that contain nicotine or tobacco. These products include cigarettes, chewing tobacco, and vaping devices, such as e-cigarettes. If you need help quitting, ask your health care provider. Do not use drugs. If you are sexually active, practice safe sex. Use a condom or other form of protection in order to prevent STIs. Take aspirin only as told by your health care provider. Make sure that you understand how much to take and what form to take. Work with your health care provider to find out whether it is safe and beneficial for you to take aspirin daily. Ask your health care provider if you need to take a cholesterol-lowering medicine (statin). Find healthy ways to manage stress, such as: Meditation, yoga, or listening to music. Journaling. Talking  to a trusted person. Spending time with friends and family. Minimize exposure to UV radiation to reduce your risk of skin cancer. Safety Always wear your seat belt while driving or riding in a vehicle. Do not drive: If you have been drinking alcohol. Do not ride with someone who has been drinking. When you are tired or distracted. While texting. If you have been using any mind-altering substances or drugs. Wear a helmet and other protective equipment during sports activities. If you have firearms in your house, make sure you follow all gun safety procedures. What's next? Visit your health care provider once a year for an annual wellness visit. Ask your health care provider how often you should have your eyes and teeth checked. Stay up to date on all vaccines. This information is not intended to replace advice given to you by your health care provider. Make sure you discuss any questions you have with your health care provider. Document Revised: 07/17/2020 Document Reviewed: 07/17/2020 Elsevier Patient Education  [place].

## 2021-10-30 MED ORDER — ATORVASTATIN CALCIUM 40 MG PO TABS: 40.0000 mg | ORAL_TABLET | Freq: Every day | ORAL | 3 refills | Status: DC

## 2022-03-14 ENCOUNTER — Other Ambulatory Visit: Payer: Self-pay | Admitting: Family Medicine

## 2022-03-14 DIAGNOSIS — F419 Anxiety disorder, unspecified: Secondary | ICD-10-CM

## 2022-08-20 LAB — HM MAMMOGRAPHY

## 2022-11-27 ENCOUNTER — Other Ambulatory Visit: Payer: Self-pay | Admitting: Family Medicine

## 2022-11-27 DIAGNOSIS — E785 Hyperlipidemia, unspecified: Secondary | ICD-10-CM
